# Patient Record
Sex: Female | Born: 1985 | Race: Black or African American | Hispanic: No | Marital: Single | Smoking: Never smoker
Health system: Southern US, Community
[De-identification: ages and names within clinical notes are randomized; demographics above are authoritative.]

## PROBLEM LIST (undated history)

## (undated) DIAGNOSIS — R519 Headache, unspecified: Secondary | ICD-10-CM

## (undated) DIAGNOSIS — A6000 Herpesviral infection of urogenital system, unspecified: Secondary | ICD-10-CM

## (undated) DIAGNOSIS — D649 Anemia, unspecified: Secondary | ICD-10-CM

## (undated) HISTORY — DX: Herpesviral infection of urogenital system, unspecified: A60.00

## (undated) HISTORY — DX: Anemia, unspecified: D64.9

---

## 2006-12-25 ENCOUNTER — Emergency Department (HOSPITAL_COMMUNITY): Admission: EM | Admit: 2006-12-25 | Discharge: 2006-12-25 | Payer: Self-pay | Admitting: Emergency Medicine

## 2006-12-25 IMAGING — US US OB TRANSVAGINAL MODIFY
1 series · 14 of 28 positions shown · non-contrast
Comparison: None.

CLINICAL DATA: Spotting.  
OBSTETRICAL ULTRASOUND <14 WKS AND TRANSVAGINAL OB US:
TECHNIQUE: Both transabdominal and transvaginal ultrasound examinations were performed for complete evaluation of the gestation as well as the maternal uterus, adnexal regions, and pelvic cul-de-sac.

[Series 1: unknown · 0.32mm/px · 14 of 34 slices shown]
[im 2/34]
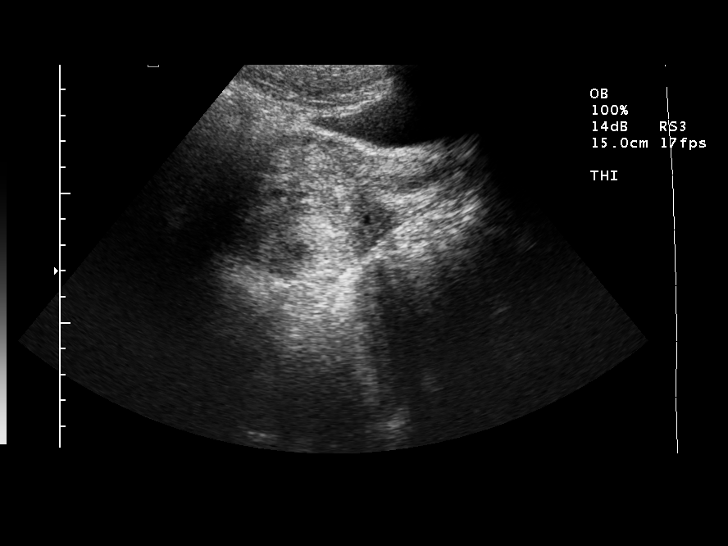
[im 4/34]
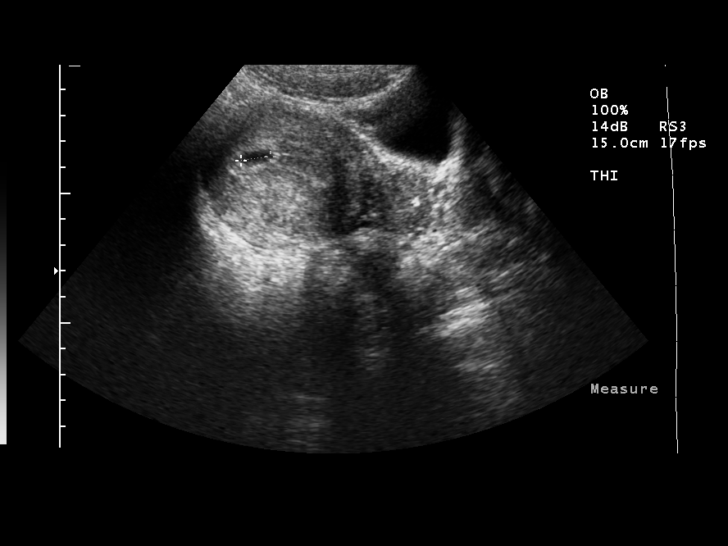
[im 7/34]
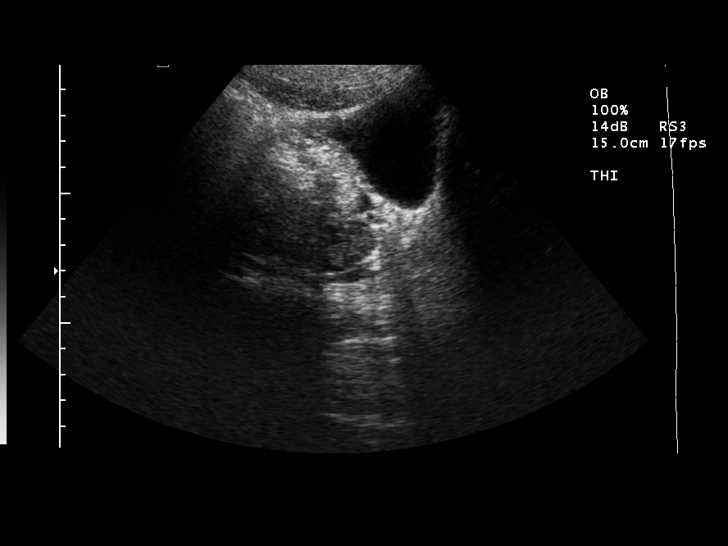
[im 9/34]
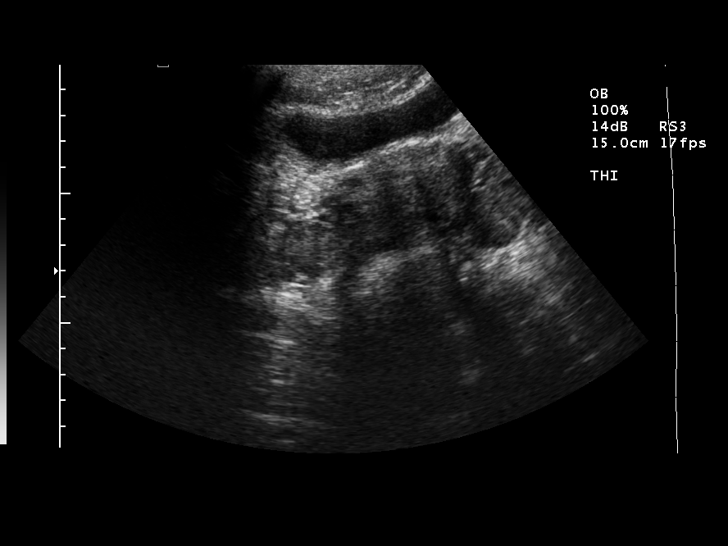
[im 12/34]
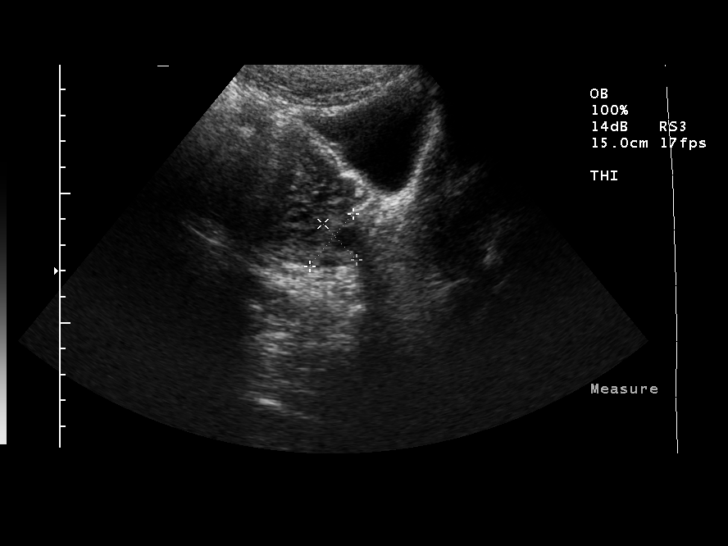
[im 14/34]
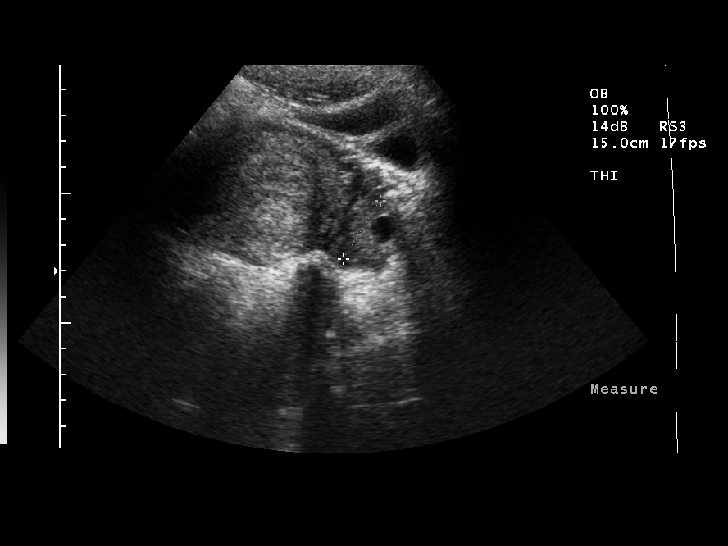
[im 16/34]
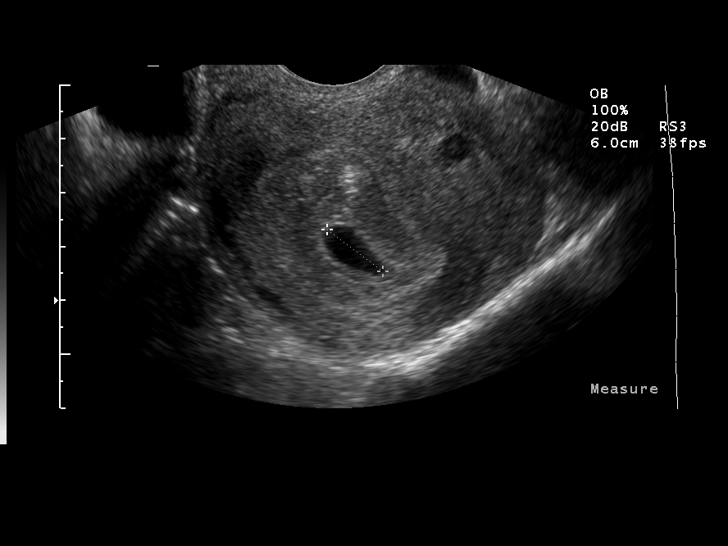
[im 19/34]
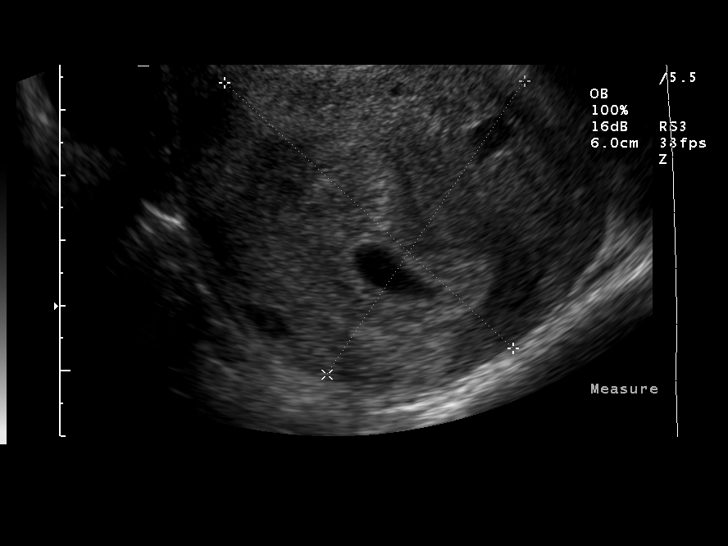
[im 21/34]
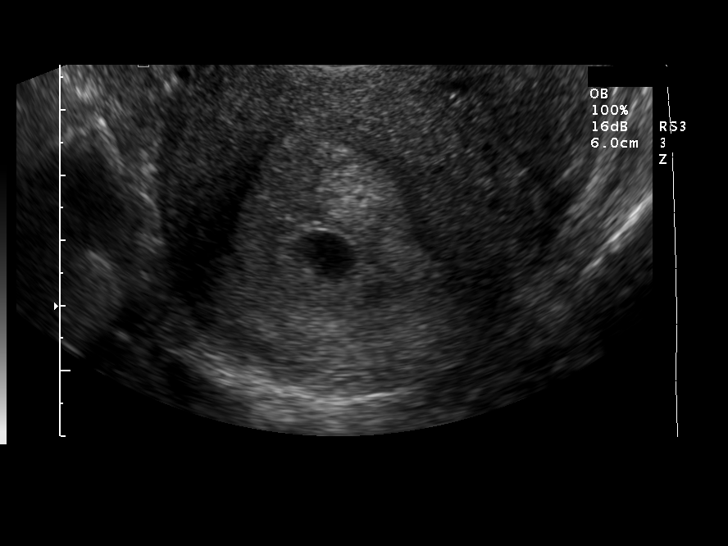
[im 24/34]
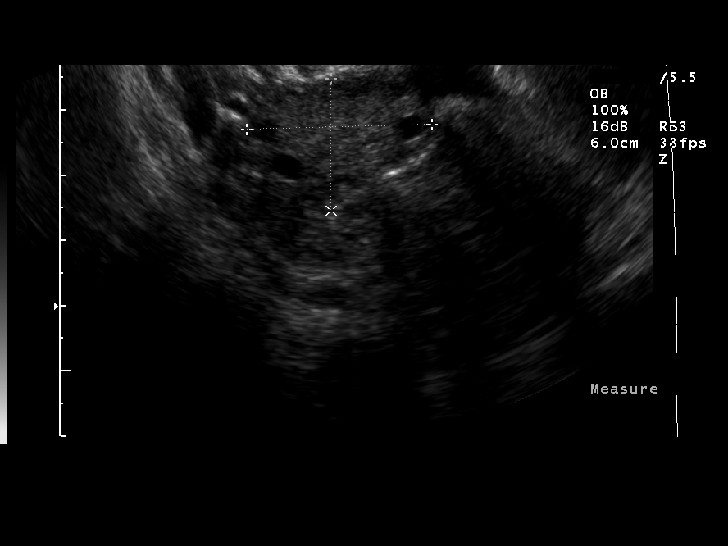
[im 26/34]
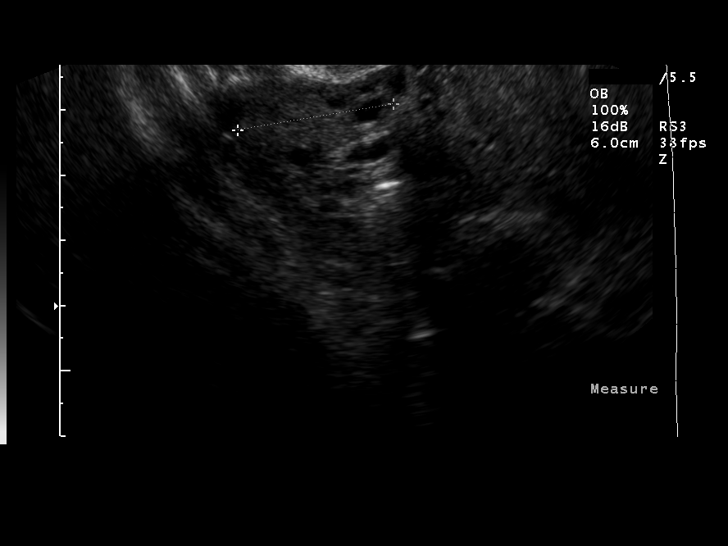
[im 29/34]
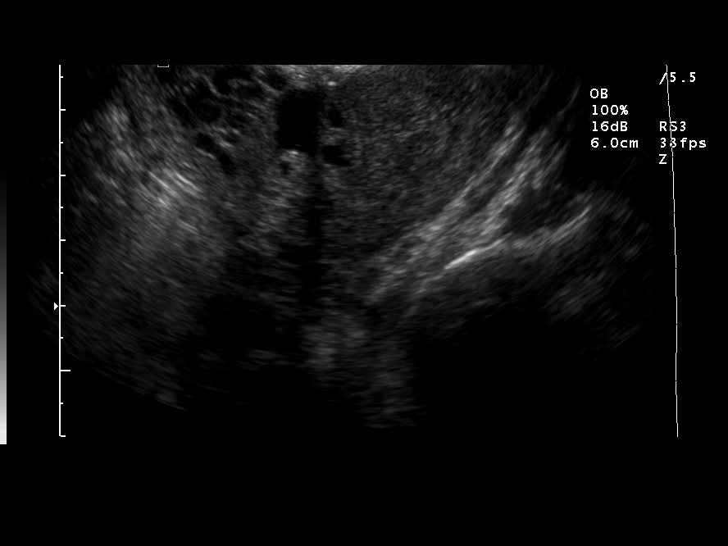
[im 31/34]
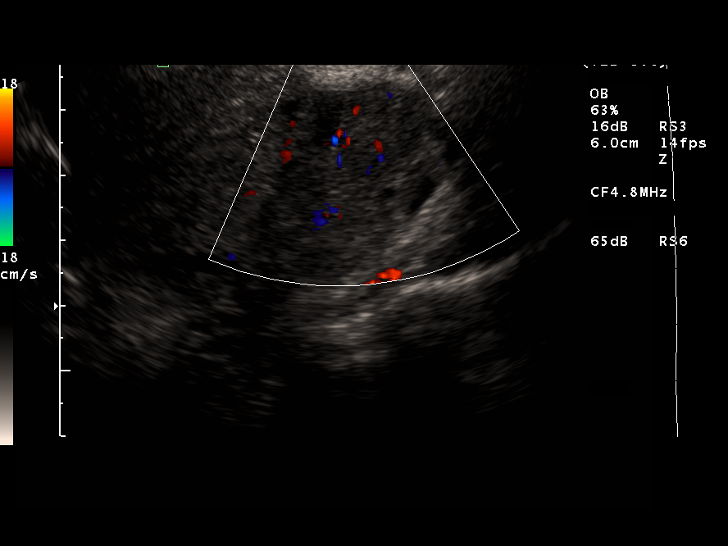
[im 34/34]
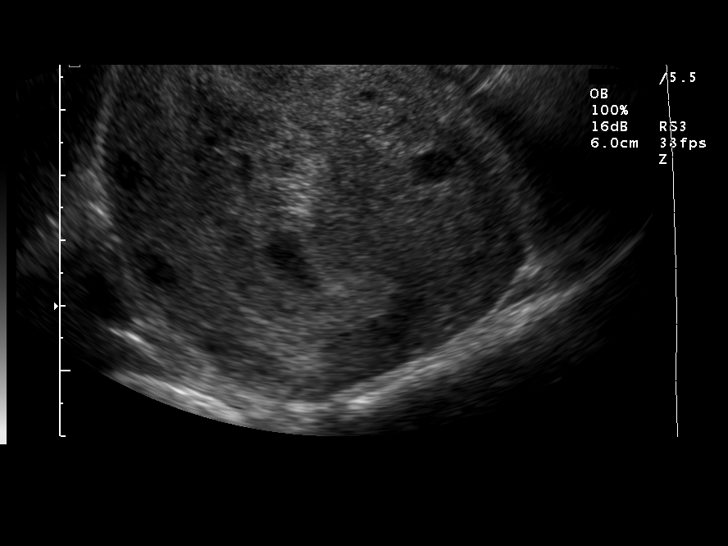

[14 of 28 positions shown; findings below may reference images not displayed]

FINDINGS: There is an intrauterine pregnancy based on a gestational sac that is ovoid in shape.  Based on gestational sac size, gestational age is estimated at 6 weeks 1 day.  There is, however, no visible embryo or yolk sac.  In addition there is evidence suggesting a subchorionic hemorrhage.  The combination of findings makes it highly likely that the patient has aborted.  
Ovaries are unremarkable.  There is a small amount of fluid in the cul-de-sac.
IMPRESSION: 1.  Intrauterine pregnancy estimated at 6 weeks 1 day by sac size.  
2.  No visible fetal pole or yolk sac.  
3.  Probable subchorionic hemorrhage.  
4.  Likely spontaneous abortion, based on the above observations.

## 2007-01-08 ENCOUNTER — Emergency Department (HOSPITAL_COMMUNITY): Admission: EM | Admit: 2007-01-08 | Discharge: 2007-01-08 | Payer: Self-pay | Admitting: Emergency Medicine

## 2007-01-08 ENCOUNTER — Inpatient Hospital Stay (HOSPITAL_COMMUNITY): Admission: AD | Admit: 2007-01-08 | Discharge: 2007-01-08 | Payer: Self-pay | Admitting: Obstetrics and Gynecology

## 2007-01-08 IMAGING — US US OB TRANSVAGINAL
1 series · 19 of 25 positions shown · non-contrast
Comparison: none

OBSTETRICAL ULTRASOUND:

 This ultrasound exam was performed in the [HOSPITAL] Ultrasound Department.  The OB US report was generated in the AS system, and faxed to the ordering physician.  This report is also available in [REDACTED] PACS.

[Series 1: us ob comp less 14 wks · 19 of 25 slices shown]
[im 1/25]
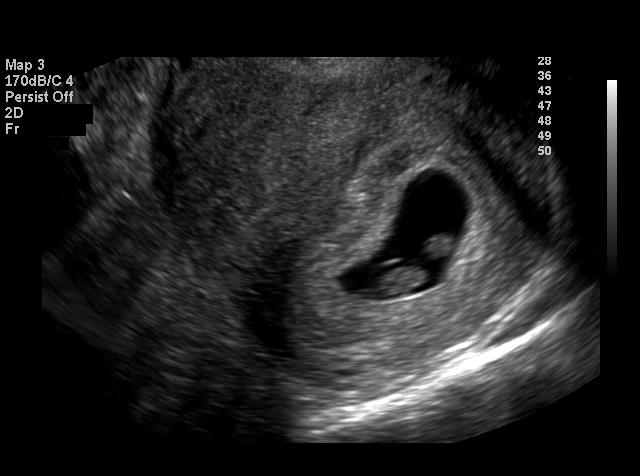
[im 2/25]
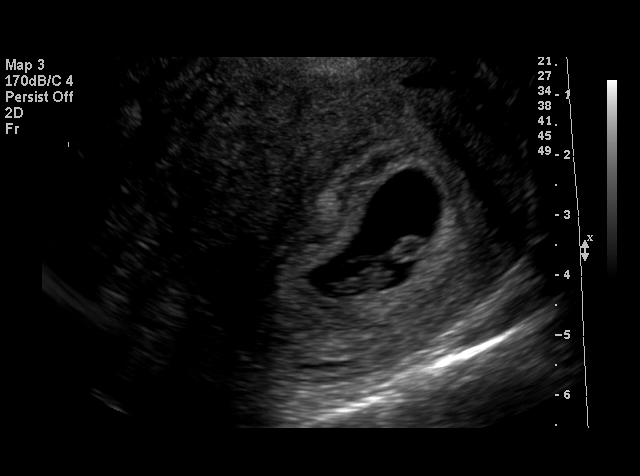
[im 4/25]
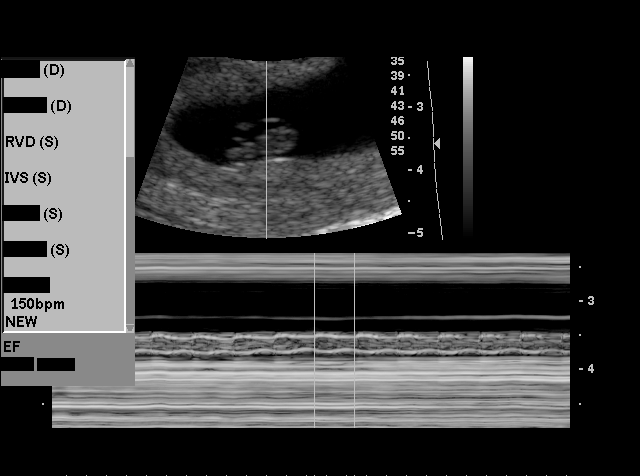
[im 5/25]
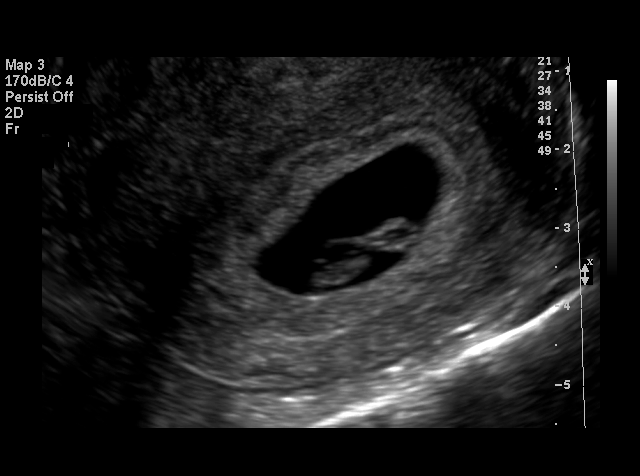
[im 6/25]
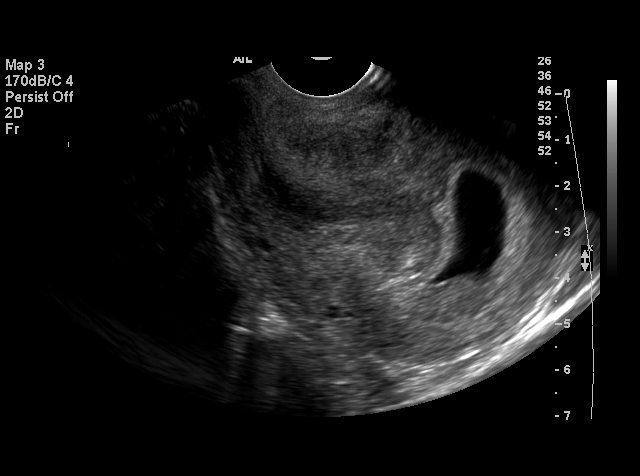
[im 8/25]
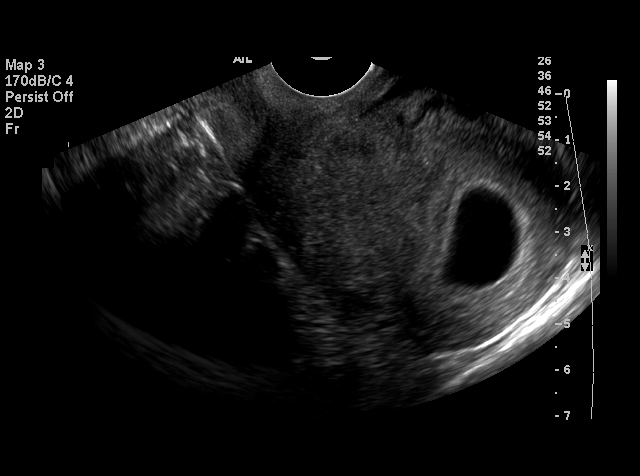
[im 9/25]
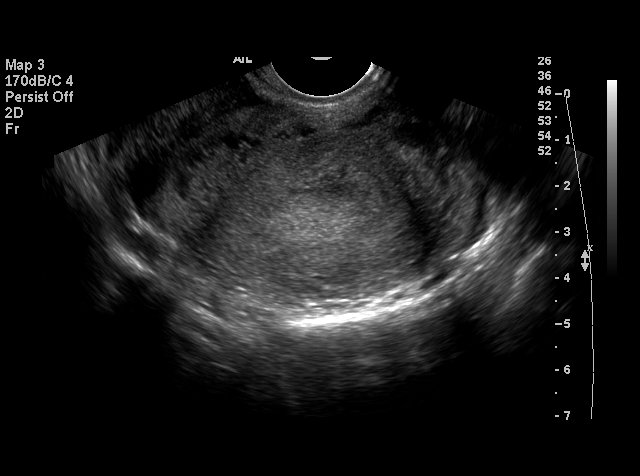
[im 10/25]
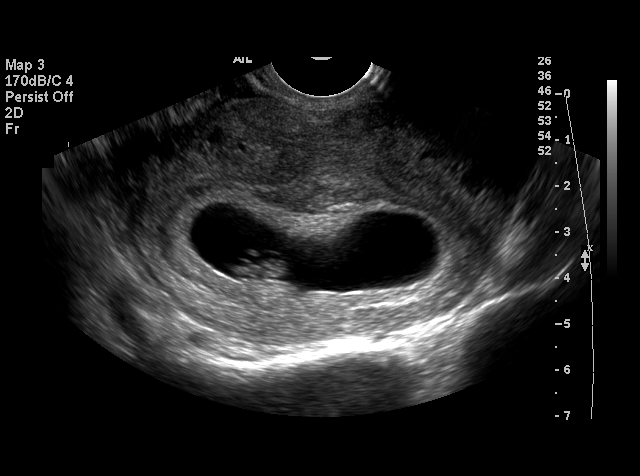
[im 12/25]
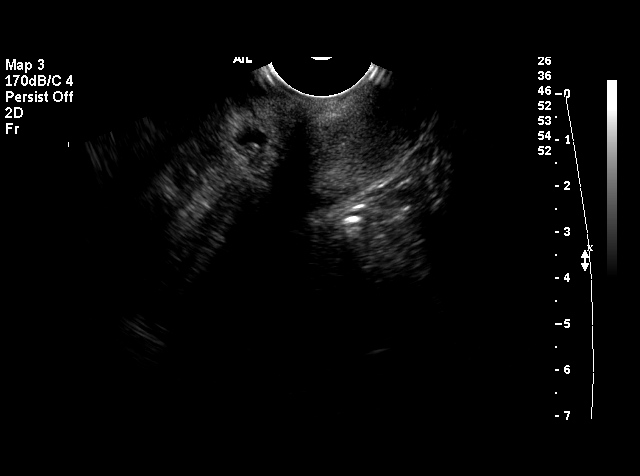
[im 13/25]
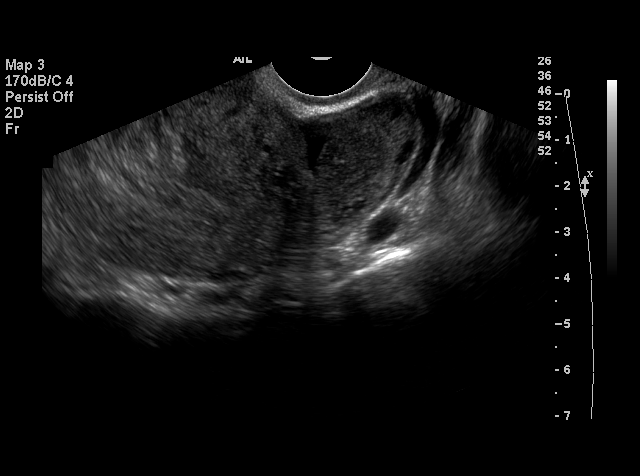
[im 14/25]
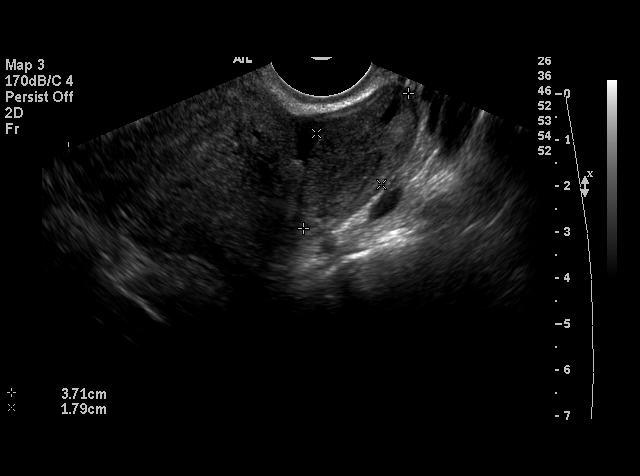
[im 16/25]
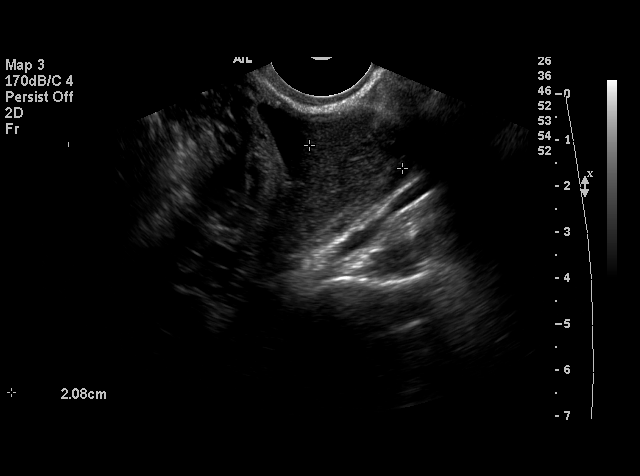
[im 17/25]
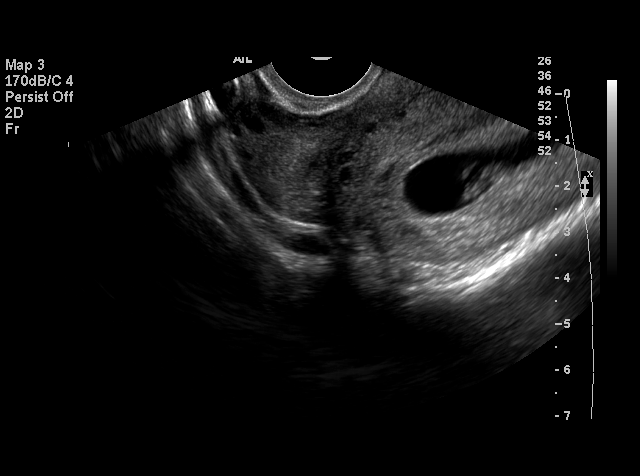
[im 18/25]
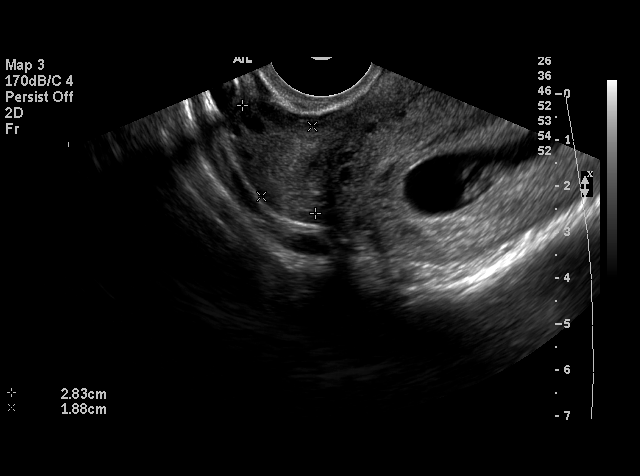
[im 20/25]
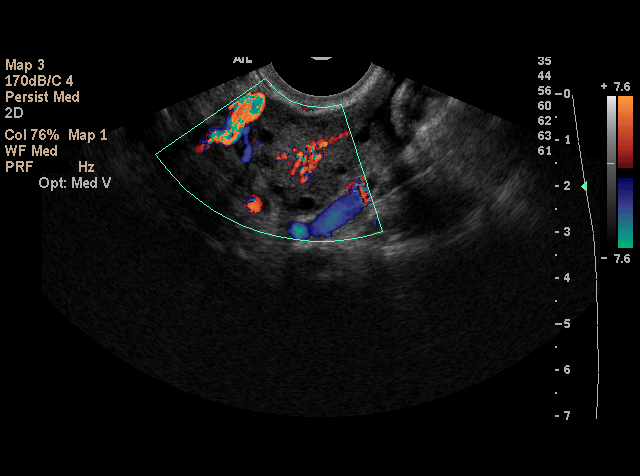
[im 21/25]
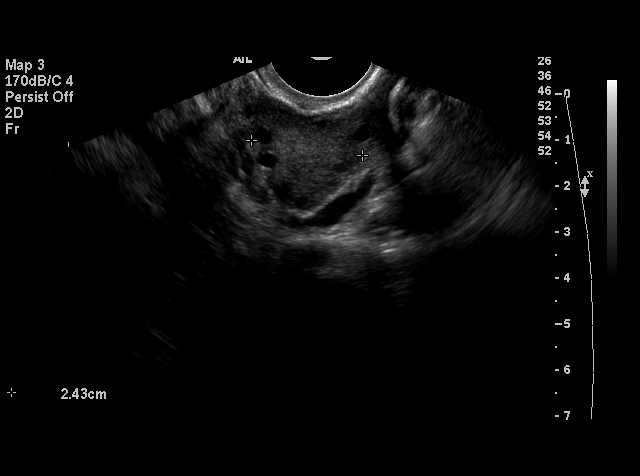
[im 22/25]
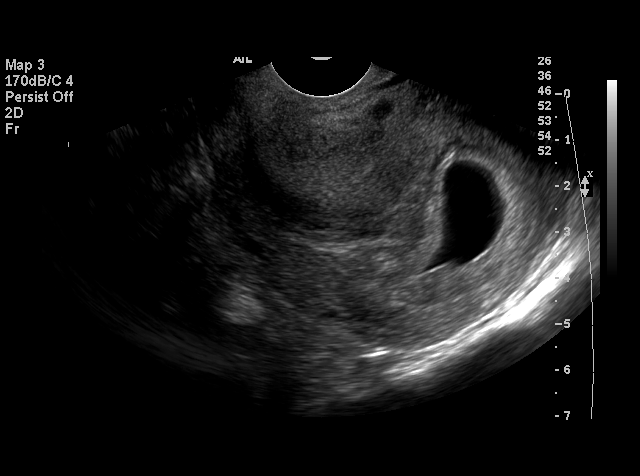
[im 24/25]
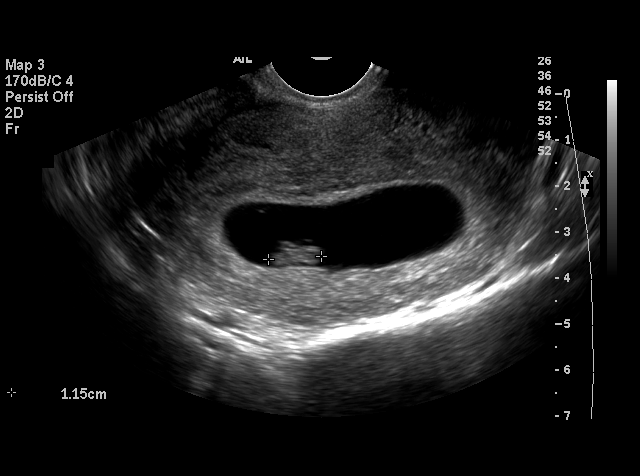
[im 25/25]
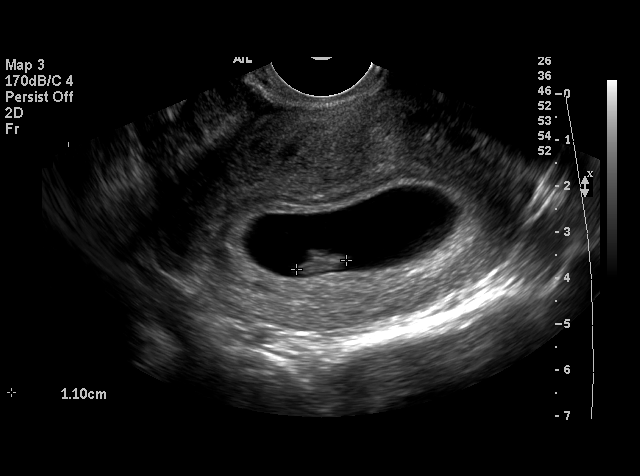

[19 of 25 positions shown; findings below may reference images not displayed]

IMPRESSION: See [facility] Obstetric US report.

## 2007-02-05 ENCOUNTER — Ambulatory Visit (HOSPITAL_COMMUNITY): Admission: RE | Admit: 2007-02-05 | Discharge: 2007-02-05 | Payer: Self-pay | Admitting: Family Medicine

## 2007-02-05 IMAGING — US US OB LIMITED
1 series · 14 of 28 positions shown · non-contrast
Comparison: none

OBSTETRICAL ULTRASOUND:
 This ultrasound was performed in The [HOSPITAL], and the [facility]/GY[facility] report will be stored to [REDACTED] PACS.

[Series 1: us ob limited · 14 of 29 slices shown]
[im 2/29]
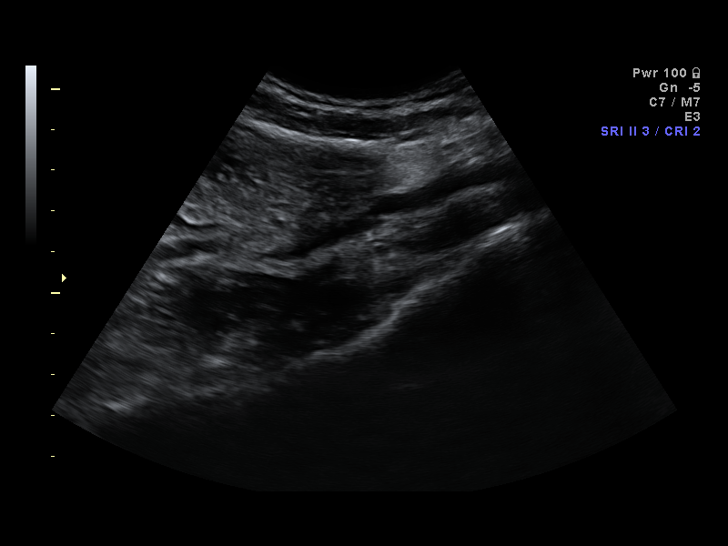
[im 4/29]
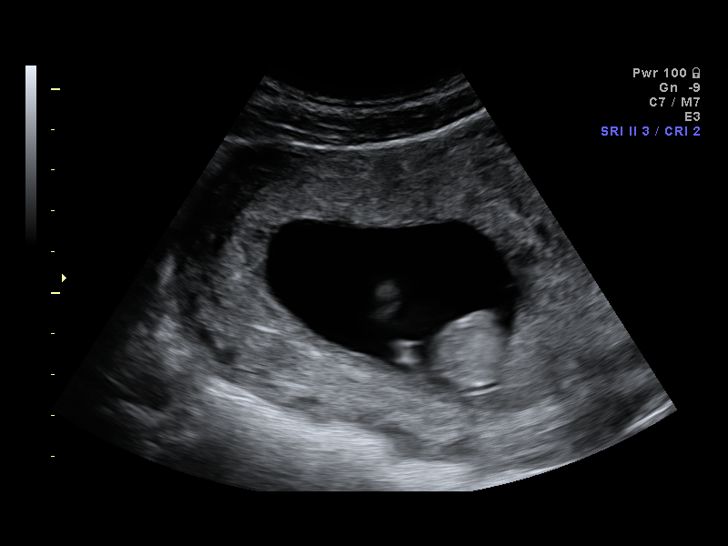
[im 6/29]
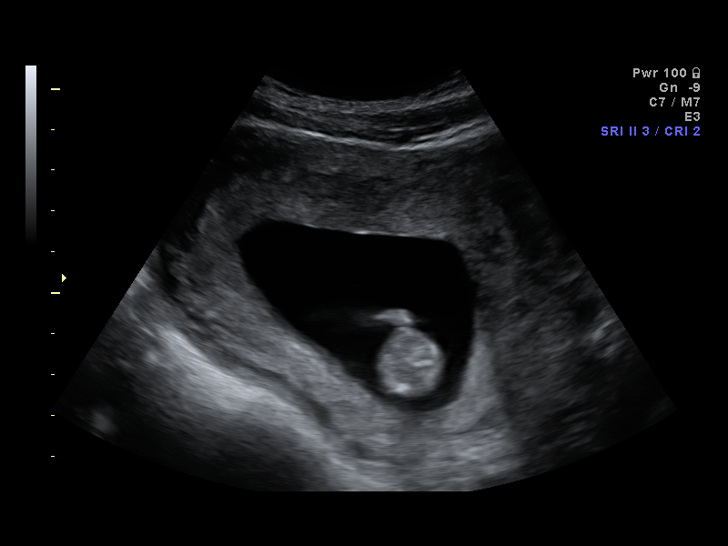
[im 8/29]
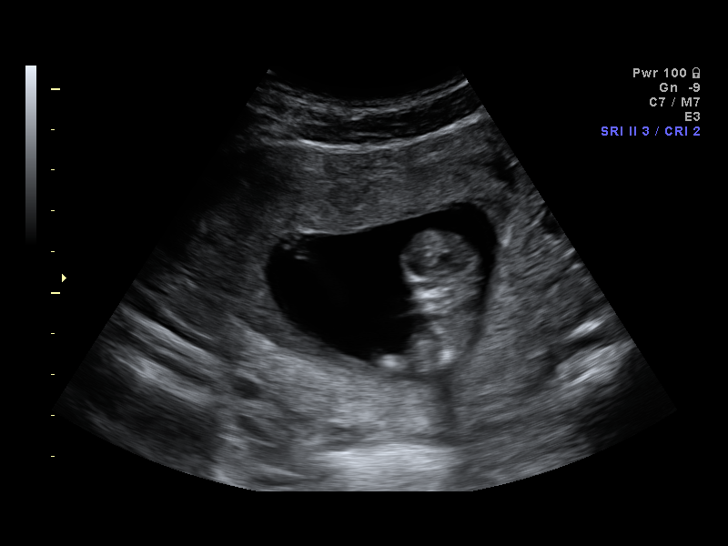
[im 10/29]
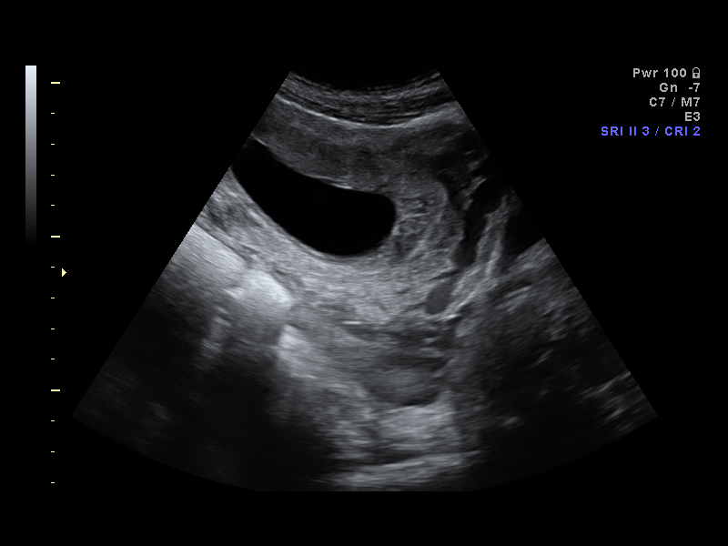
[im 12/29]
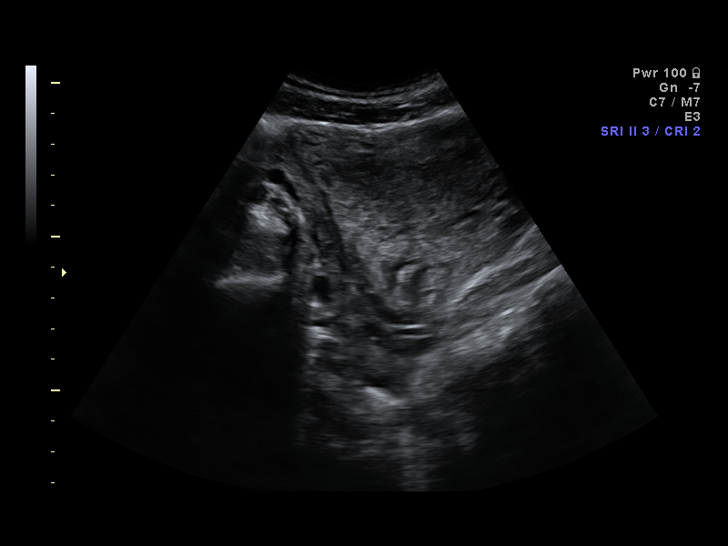
[im 14/29]
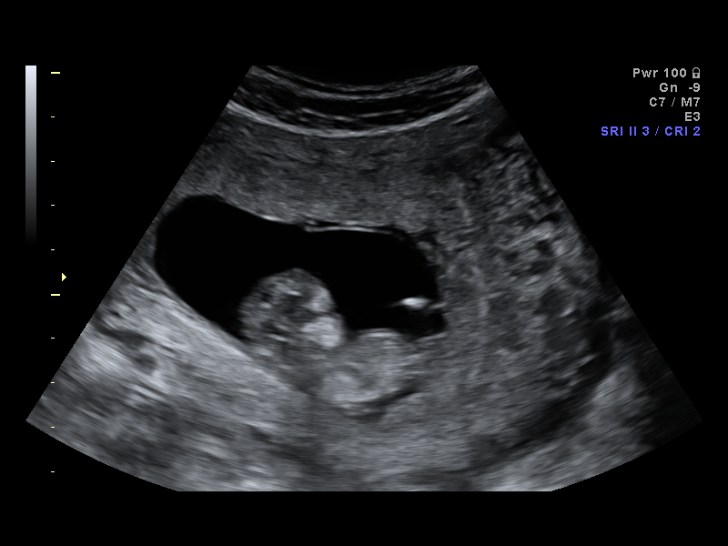
[im 16/29]
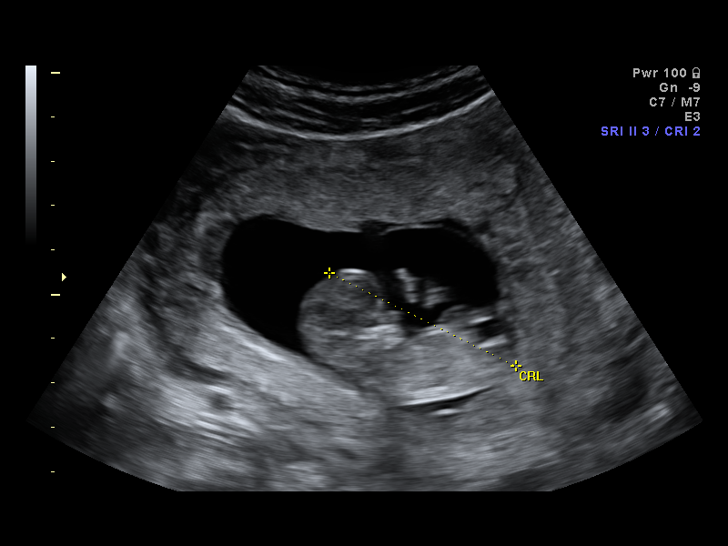
[im 18/29]
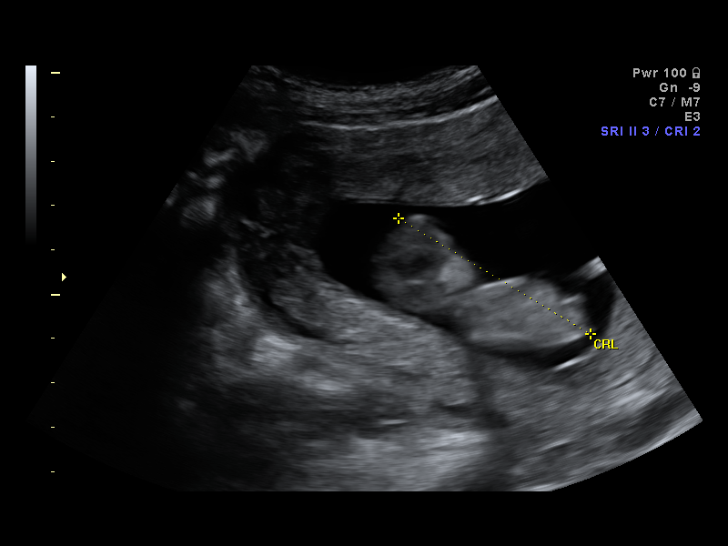
[im 20/29]
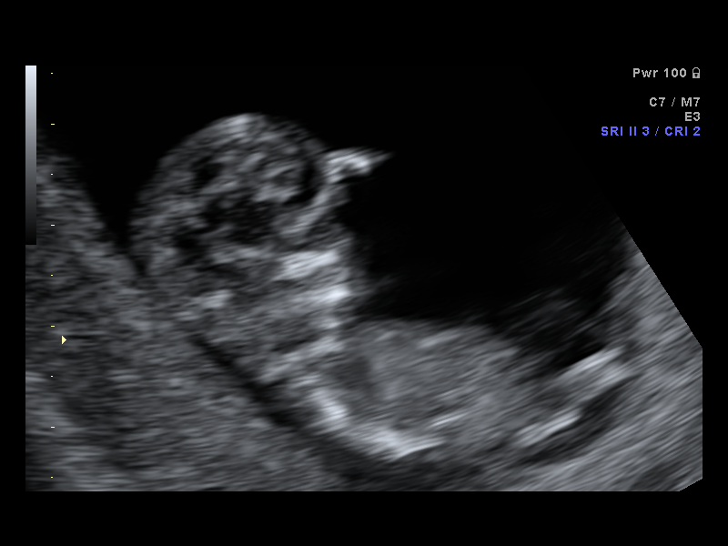
[im 22/29]
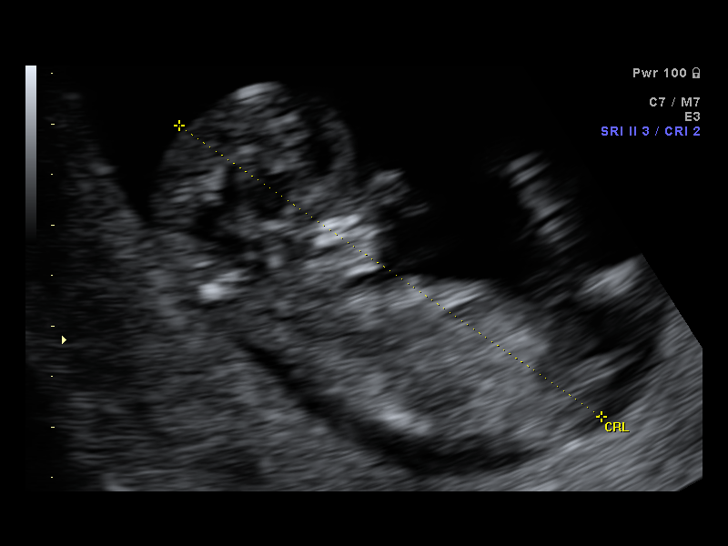
[im 24/29]
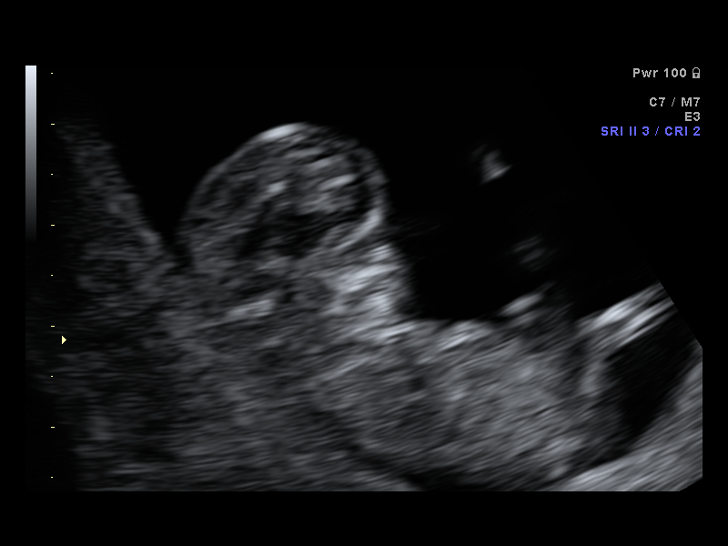
[im 26/29]
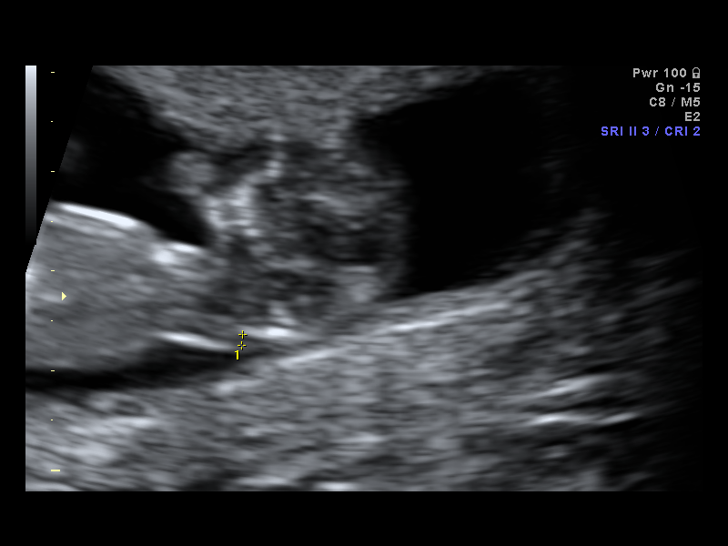
[im 29/29]
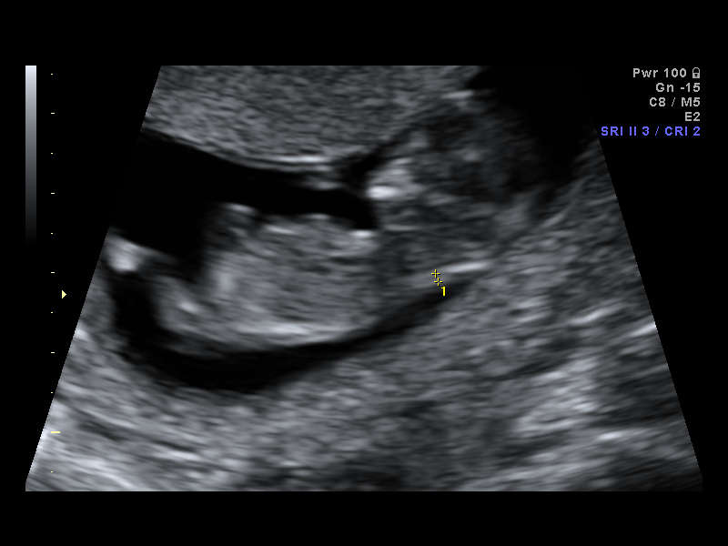

[14 of 28 positions shown; findings below may reference images not displayed]

IMPRESSION: The [facility] report has also been faxed to the ordering physician.

## 2007-02-12 ENCOUNTER — Ambulatory Visit (HOSPITAL_COMMUNITY): Admission: RE | Admit: 2007-02-12 | Discharge: 2007-02-12 | Payer: Self-pay | Admitting: Family Medicine

## 2007-02-12 IMAGING — US US OB NUCHAL TRANSLUCENCY 1ST GEST
1 series · 8 of 8 positions shown · non-contrast
Comparison: none

OBSTETRICAL ULTRASOUND:
 This ultrasound was performed in The [HOSPITAL], and the [facility] report will be stored to [REDACTED] PACS.

[Series 1: us ob nuchal translucency 1st gest · 8 of 8 slices shown]
[im 1/8]
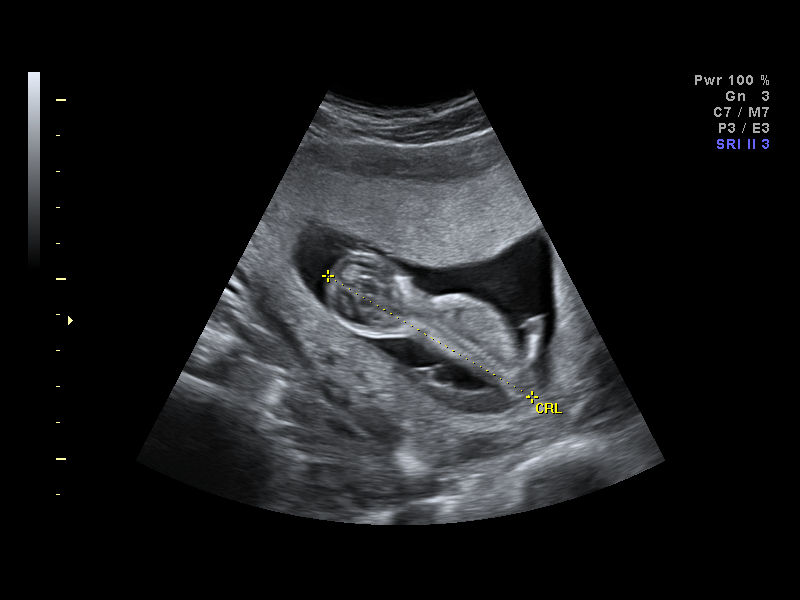
[im 2/8]
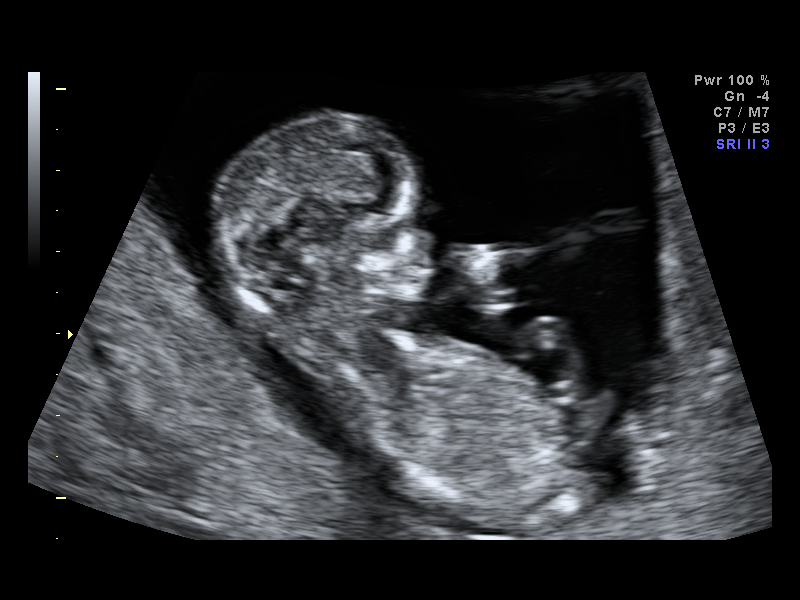
[im 3/8]
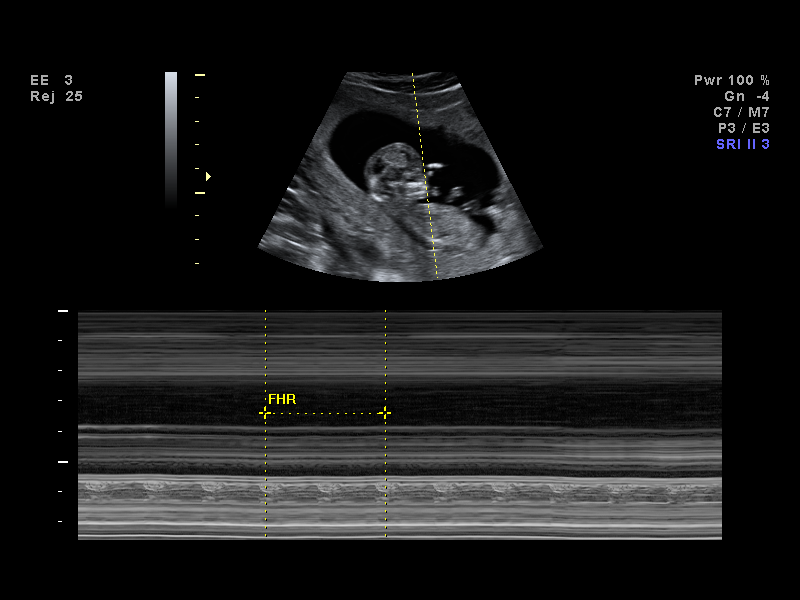
[im 4/8]
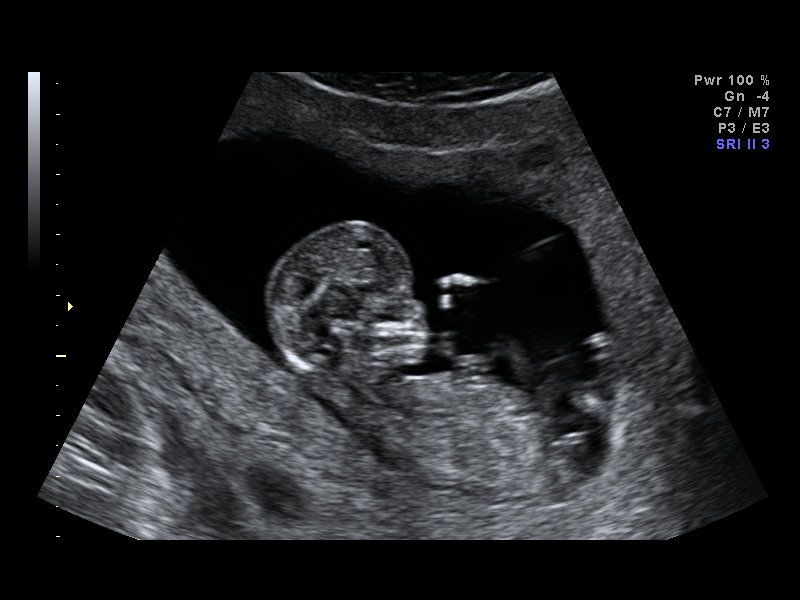
[im 5/8]
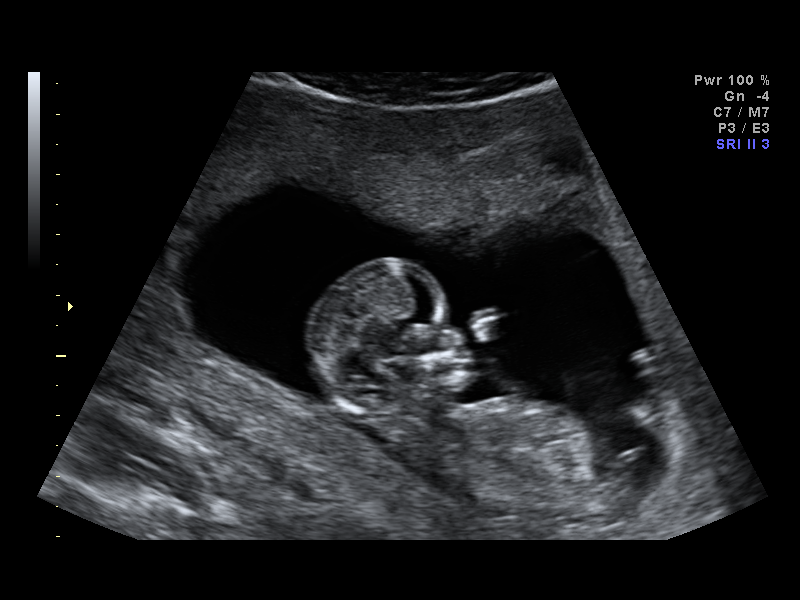
[im 6/8]
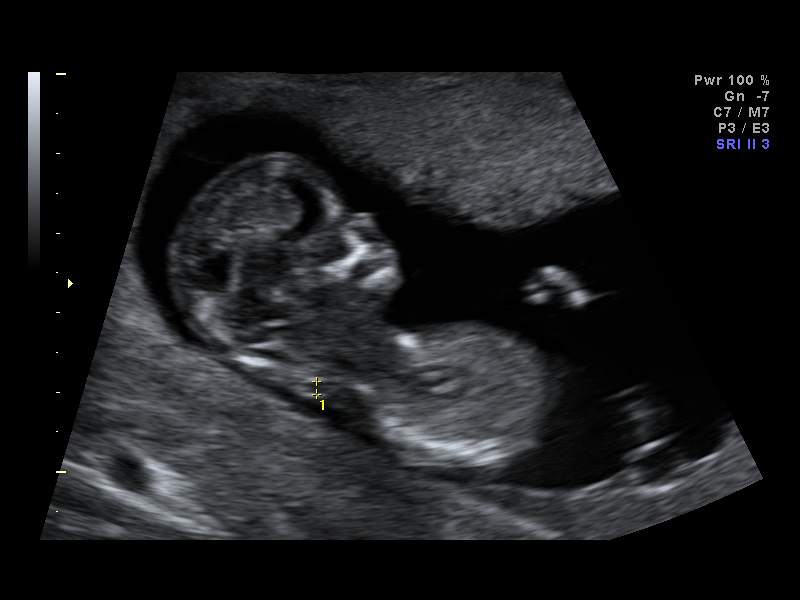
[im 7/8]
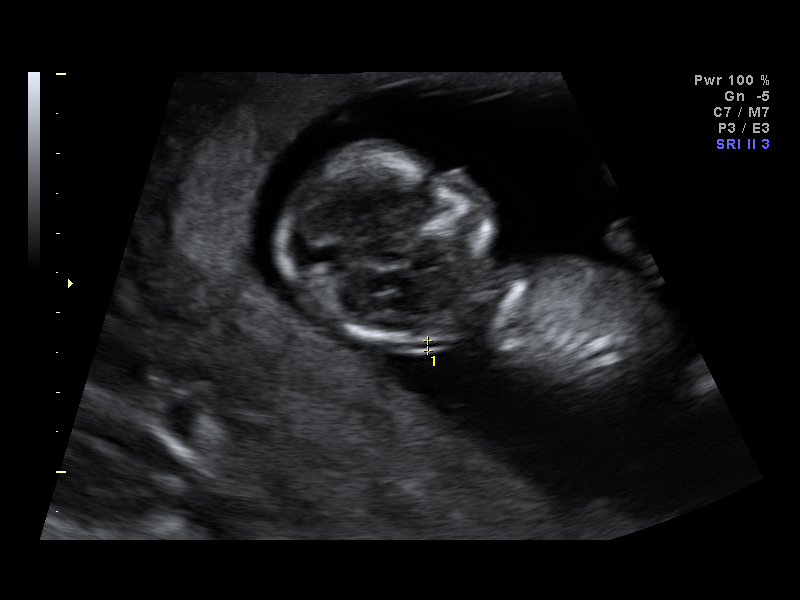
[im 8/8]
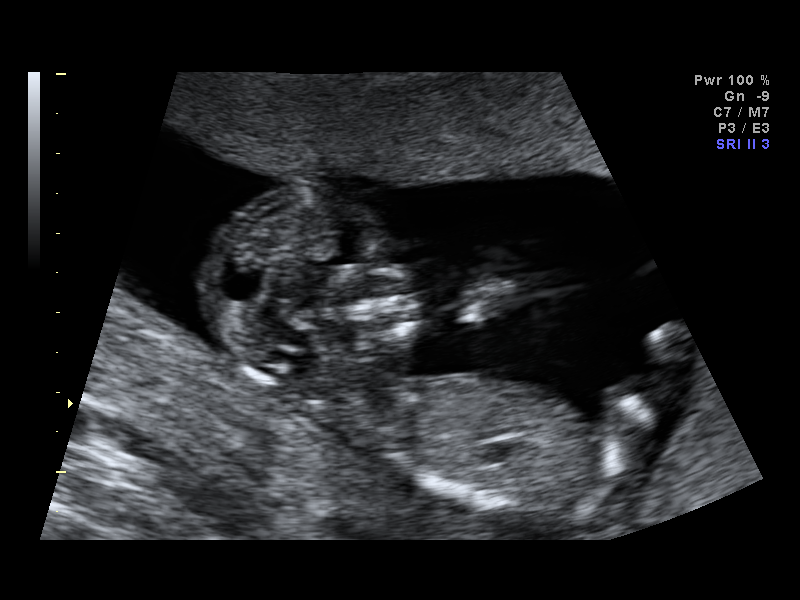

[8 of 8 positions shown; findings below may reference images not displayed]

IMPRESSION: The [facility] report has also been faxed to the ordering physician.

## 2007-03-23 ENCOUNTER — Ambulatory Visit (HOSPITAL_COMMUNITY): Admission: RE | Admit: 2007-03-23 | Discharge: 2007-03-23 | Payer: Self-pay | Admitting: Family Medicine

## 2007-03-23 IMAGING — US US OB DETAIL+14 WK
1 series · 14 of 28 positions shown · non-contrast
Comparison: none

OBSTETRICAL ULTRASOUND:
 This ultrasound was performed in The [HOSPITAL], and the [facility] report will be stored to [REDACTED] PACS.

[Series 1: us ob detail+14 wk · 14 of 101 slices shown]
[im 4/101]
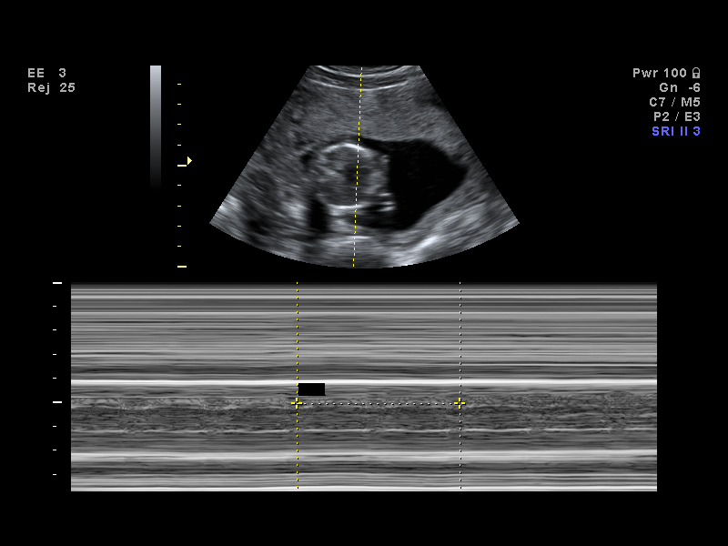
[im 12/101]
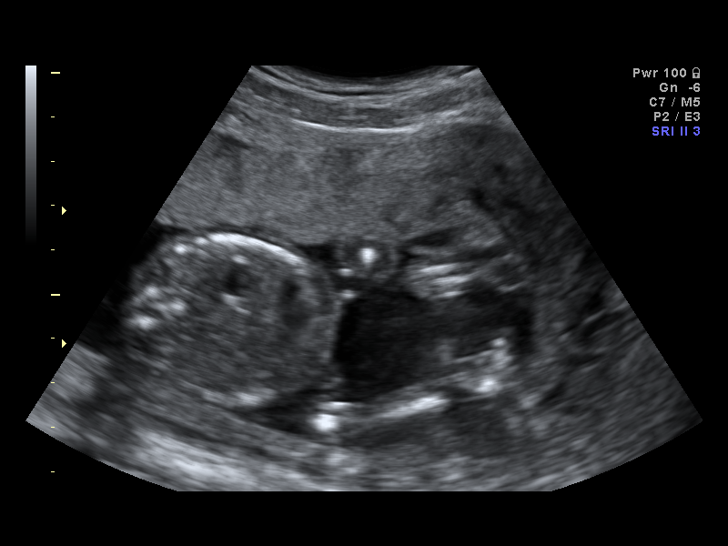
[im 19/101]
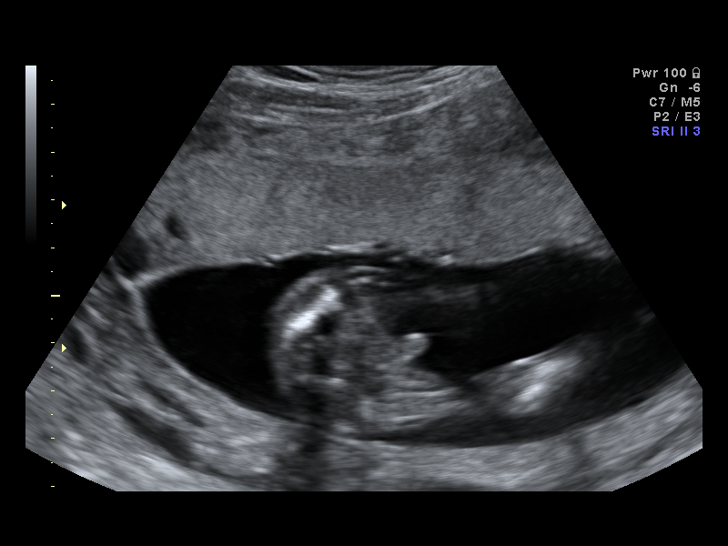
[im 26/101]
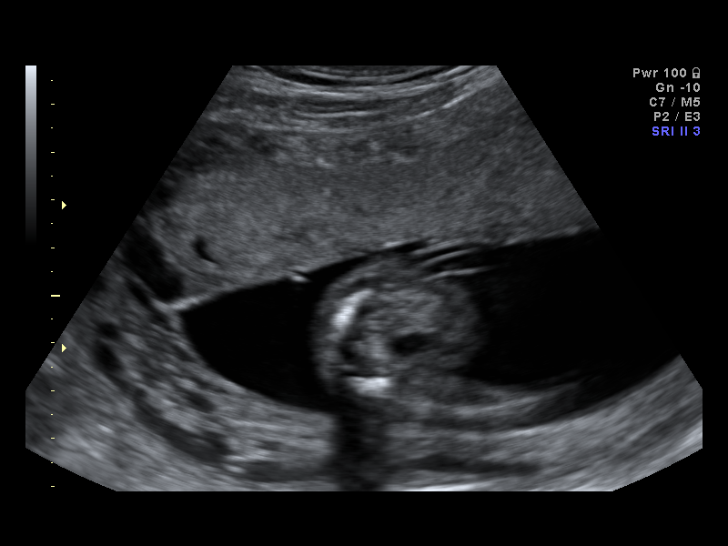
[im 34/101]
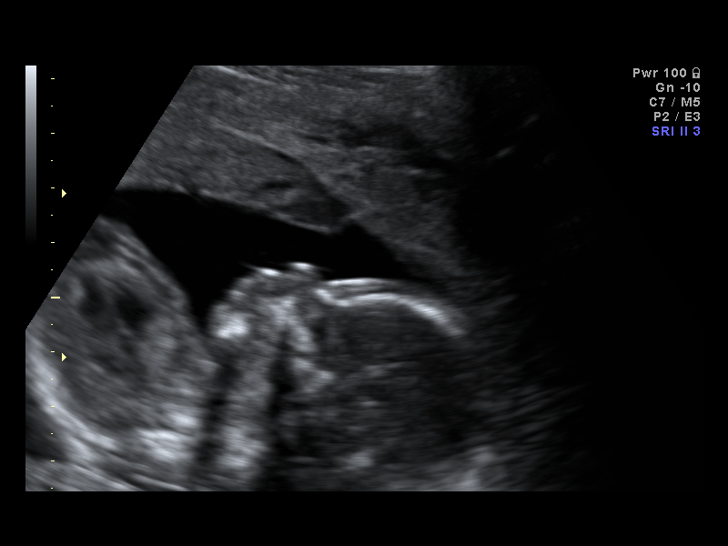
[im 41/101]
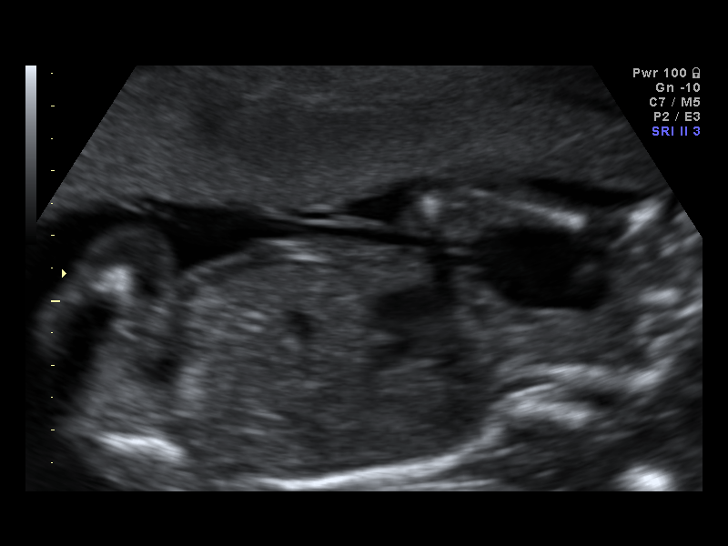
[im 49/101]
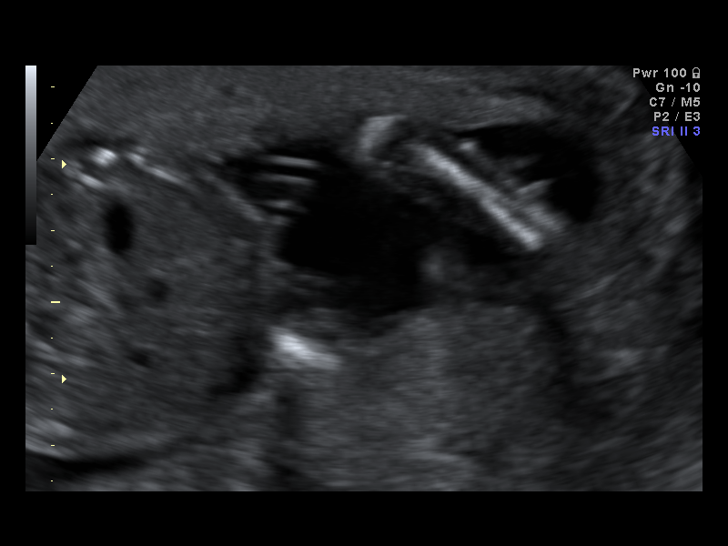
[im 56/101]
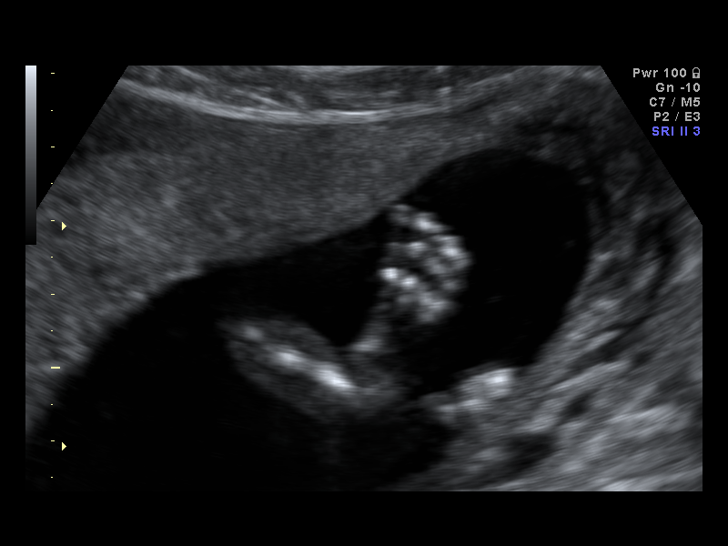
[im 63/101]
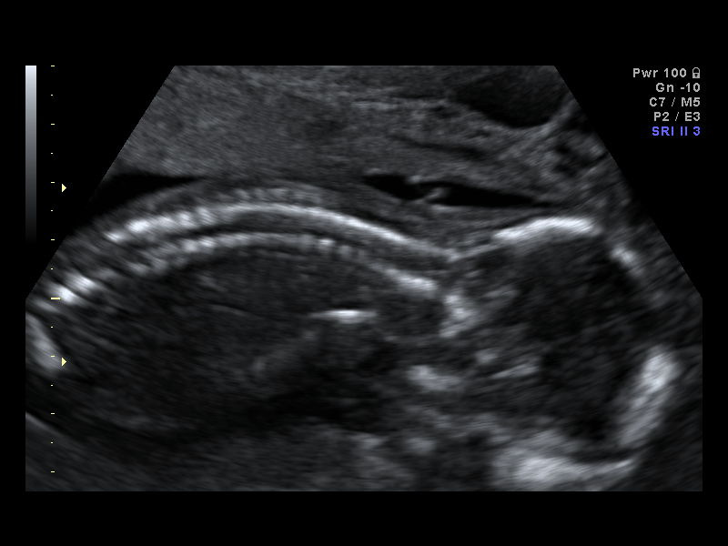
[im 71/101]
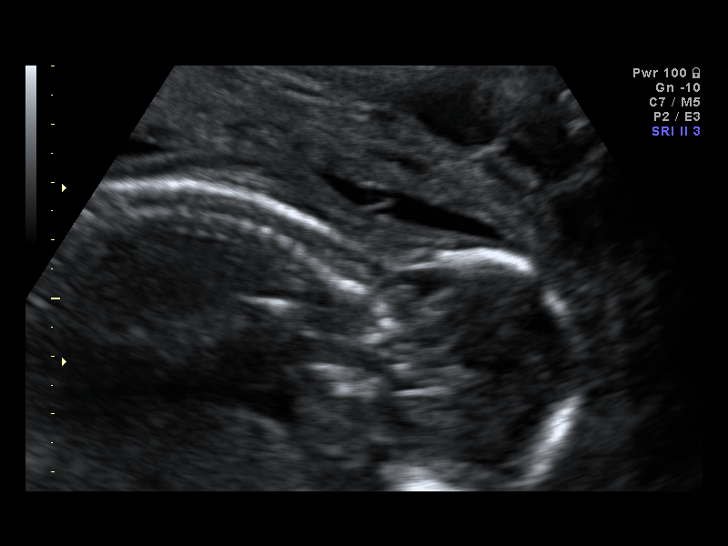
[im 78/101]
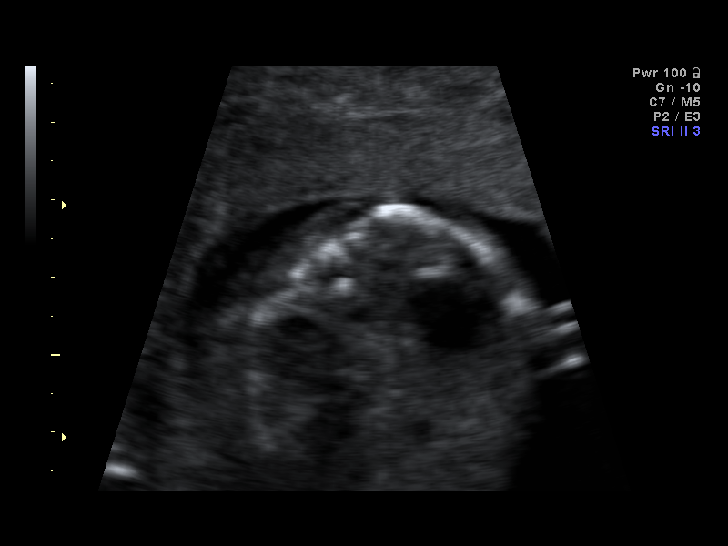
[im 86/101]
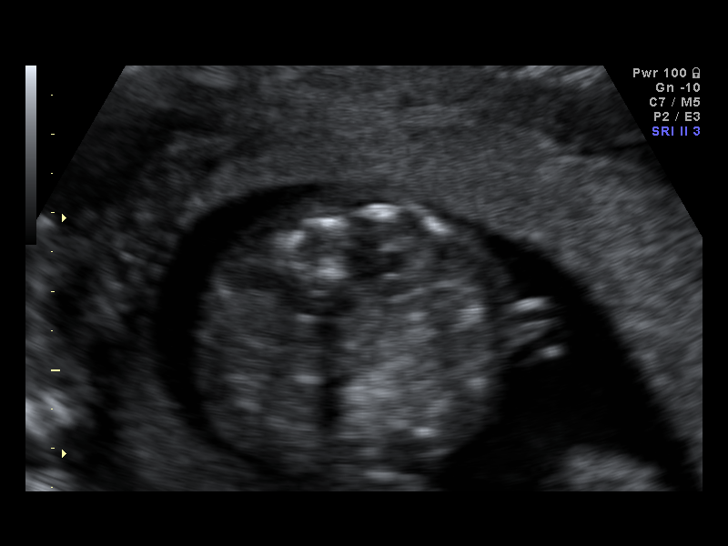
[im 93/101]
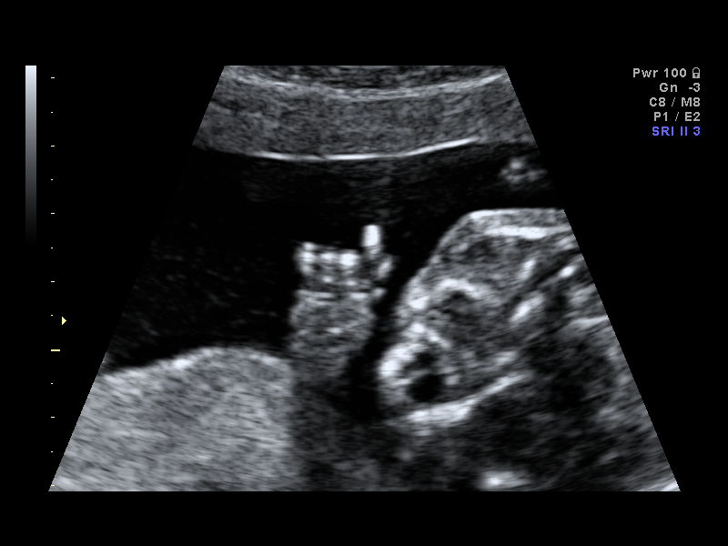
[im 101/101]
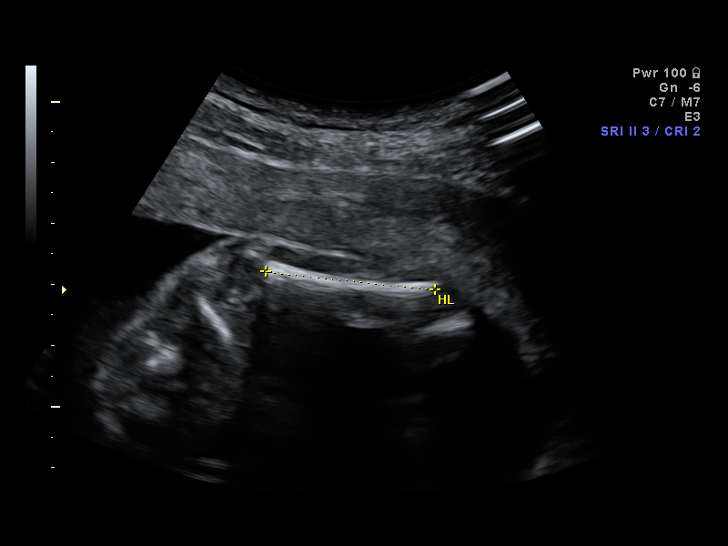

[14 of 28 positions shown; findings below may reference images not displayed]

IMPRESSION: The [facility] report has also been faxed to the ordering physician.

## 2007-04-06 ENCOUNTER — Inpatient Hospital Stay (HOSPITAL_COMMUNITY): Admission: AD | Admit: 2007-04-06 | Discharge: 2007-04-06 | Payer: Self-pay | Admitting: Obstetrics & Gynecology

## 2007-04-06 ENCOUNTER — Ambulatory Visit: Payer: Self-pay | Admitting: Physician Assistant

## 2007-08-16 ENCOUNTER — Inpatient Hospital Stay (HOSPITAL_COMMUNITY): Admission: AD | Admit: 2007-08-16 | Discharge: 2007-08-16 | Payer: Self-pay | Admitting: Gynecology

## 2007-08-18 ENCOUNTER — Inpatient Hospital Stay (HOSPITAL_COMMUNITY): Admission: AD | Admit: 2007-08-18 | Discharge: 2007-08-21 | Payer: Self-pay | Admitting: Gynecology

## 2007-08-18 ENCOUNTER — Ambulatory Visit: Payer: Self-pay | Admitting: Obstetrics and Gynecology

## 2007-08-19 ENCOUNTER — Encounter (INDEPENDENT_AMBULATORY_CARE_PROVIDER_SITE_OTHER): Payer: Self-pay | Admitting: Gynecology

## 2009-04-21 ENCOUNTER — Emergency Department (HOSPITAL_COMMUNITY): Admission: EM | Admit: 2009-04-21 | Discharge: 2009-04-21 | Payer: Self-pay | Admitting: Emergency Medicine

## 2011-01-23 LAB — POCT PREGNANCY, URINE: Preg Test, Ur: NEGATIVE

## 2011-01-23 LAB — URINALYSIS, ROUTINE W REFLEX MICROSCOPIC
Nitrite: NEGATIVE
Protein, ur: NEGATIVE mg/dL
Specific Gravity, Urine: 1.014 (ref 1.005–1.030)

## 2011-01-23 LAB — URINE MICROSCOPIC-ADD ON

## 2011-07-26 LAB — CBC
HCT: 32 — ABNORMAL LOW
HCT: 36.7
Hemoglobin: 10.9 — ABNORMAL LOW
Hemoglobin: 12.6
MCV: 84.4
MCV: 86.8
Platelets: 323
RDW: 13.2
RDW: 13.6
WBC: 15.8 — ABNORMAL HIGH

## 2011-07-26 LAB — RPR: RPR Ser Ql: NONREACTIVE

## 2011-08-03 LAB — URINALYSIS, ROUTINE W REFLEX MICROSCOPIC
Bilirubin Urine: NEGATIVE
Specific Gravity, Urine: 1.02
Urobilinogen, UA: 0.2

## 2011-08-03 LAB — URINE MICROSCOPIC-ADD ON

## 2012-04-25 ENCOUNTER — Emergency Department (HOSPITAL_COMMUNITY)
Admission: EM | Admit: 2012-04-25 | Discharge: 2012-04-26 | Disposition: A | Discharge status: Home / Self Care | Payer: Self-pay | Attending: Emergency Medicine | Admitting: Emergency Medicine

## 2012-04-25 ENCOUNTER — Encounter (HOSPITAL_COMMUNITY): Payer: Self-pay | Admitting: *Deleted

## 2012-04-25 DIAGNOSIS — R197 Diarrhea, unspecified: Secondary | ICD-10-CM | POA: Insufficient documentation

## 2012-04-25 DIAGNOSIS — N898 Other specified noninflammatory disorders of vagina: Secondary | ICD-10-CM | POA: Insufficient documentation

## 2012-04-25 DIAGNOSIS — A64 Unspecified sexually transmitted disease: Secondary | ICD-10-CM | POA: Insufficient documentation

## 2012-04-25 DIAGNOSIS — L2989 Other pruritus: Secondary | ICD-10-CM | POA: Insufficient documentation

## 2012-04-25 DIAGNOSIS — R109 Unspecified abdominal pain: Secondary | ICD-10-CM | POA: Insufficient documentation

## 2012-04-25 DIAGNOSIS — L298 Other pruritus: Secondary | ICD-10-CM | POA: Insufficient documentation

## 2012-04-25 LAB — URINE MICROSCOPIC-ADD ON

## 2012-04-25 LAB — COMPREHENSIVE METABOLIC PANEL
AST: 13 U/L (ref 0–37)
CO2: 23 mEq/L (ref 19–32)
Calcium: 9.3 mg/dL (ref 8.4–10.5)
Chloride: 103 mEq/L (ref 96–112)
GFR calc Af Amer: >90 mL/min (ref >90)
GFR calc non Af Amer: >90 mL/min (ref >90)
Potassium: 3.8 mEq/L (ref 3.5–5.1)
Sodium: 136 mEq/L (ref 135–145)
Total Bilirubin: 0.7 mg/dL (ref 0.3–1.2)

## 2012-04-25 LAB — URINALYSIS, ROUTINE W REFLEX MICROSCOPIC
Protein, ur: NEGATIVE mg/dL
Specific Gravity, Urine: 1.02 (ref 1.005–1.030)
Urobilinogen, UA: 1 mg/dL (ref 0.0–1.0)
pH: 7 (ref 5.0–8.0)

## 2012-04-25 LAB — CBC
HCT: 37.7 % (ref 36.0–46.0)
MCHC: 33.7 g/dL (ref 30.0–36.0)
MCV: 85.5 fL (ref 78.0–100.0)
Platelets: 308 10*3/uL (ref 150–400)
RDW: 12.4 % (ref 11.5–15.5)
WBC: 5.1 10*3/uL (ref 4.0–10.5)

## 2012-04-25 LAB — POCT PREGNANCY, URINE: Preg Test, Ur: NEGATIVE

## 2012-04-25 NOTE — ED Notes (Signed)
Reports lower abd pain since yesterday, having diarrhea and mild vaginal discharge/itching. Denies urinary symptoms.

## 2012-04-26 LAB — WET PREP, GENITAL: Trich, Wet Prep: NONE SEEN

## 2012-04-26 MED ORDER — LIDOCAINE HCL (PF) 1 % IJ SOLN
INTRAMUSCULAR | Status: AC
  Administered 2012-04-26: 5 mL
  Filled 2012-04-26: qty 5

## 2012-04-26 MED ORDER — CEFTRIAXONE SODIUM 250 MG IJ SOLR
250.0000 mg | Freq: Once | INTRAMUSCULAR | Status: AC
  Administered 2012-04-26: 250 mg via INTRAMUSCULAR
  Filled 2012-04-26: qty 250

## 2012-04-26 MED ORDER — AZITHROMYCIN 250 MG PO TABS
1000.0000 mg | ORAL_TABLET | Freq: Once | ORAL | Status: AC
  Administered 2012-04-26: 1000 mg via ORAL
  Filled 2012-04-26: qty 4

## 2012-04-26 NOTE — ED Notes (Signed)
Pt ambulated with a  Steady gait; VSS; A&Ox3; no signs of distress; respirations even and unlabored; skin warm and dry; no questions.  

## 2012-04-26 NOTE — ED Provider Notes (Addendum)
History     CSN: [id number]  Arrival date & time 04/25/12  1557   First MD Initiated Contact with Patient 04/25/12 2202      Chief Complaint  Patient presents with  . Abdominal Pain    (Consider location/radiation/quality/duration/timing/severity/associated sxs/prior treatment) Patient is a 26 y.o. female presenting with abdominal pain. The history is provided by the patient.  Abdominal Pain The primary symptoms of the illness include abdominal pain, diarrhea and vaginal discharge. The primary symptoms of the illness do not include fever, nausea, vomiting or dysuria. The current episode started yesterday (vaginal discharge for 1 month). The onset of the illness was gradual. The problem has been gradually worsening.  The pain came on gradually. The abdominal pain has been unchanged since its onset. The abdominal pain is located in the LLQ and suprapubic region. The abdominal pain does not radiate. The severity of the abdominal pain is 2/10. The abdominal pain is relieved by nothing. Exacerbated by: nothing.  The diarrhea began today. The diarrhea is watery. The diarrhea occurs once per day.   Onset: about 1 month. Vaginal discharge is a new problem. The patient believes that the vaginal discharge is unchanged since it began. The amount of discharge is scant. The color of the discharge is clear. The vaginal discharge is associated with itching. The vaginal discharge is not associated with dysuria.   The patient states that she believes she is currently not pregnant. Significant associated medical issues do not include diabetes or gallstones.    History reviewed. No pertinent past medical history.  History reviewed. No pertinent past surgical history.  History reviewed. No pertinent family history.  History  Substance Use Topics  . Smoking status: Not on file  . Smokeless tobacco: Not on file  . Alcohol Use: No    OB History    Grav Para Term Preterm Abortions TAB SAB Ect Mult  Living                  Review of Systems  Constitutional: Negative for fever.  Gastrointestinal: Positive for abdominal pain and diarrhea. Negative for nausea and vomiting.  Genitourinary: Positive for vaginal discharge. Negative for dysuria.  Skin: Positive for itching.  All other systems reviewed and are negative.    Allergies  Review of patient's allergies indicates no known allergies.  Home Medications  No current outpatient prescriptions on file.  BP 111/78  Pulse 78  Temp 98.1 F (36.7 C) (Oral)  Resp 18  SpO2 100%  LMP 03/28/2012  Physical Exam  Nursing note and vitals reviewed. Constitutional: She is oriented to person, place, and time. She appears well-developed and well-nourished. No distress.  HENT:  Head: Normocephalic and atraumatic.  Mouth/Throat: Oropharynx is clear and moist.  Eyes: Conjunctivae and EOM are normal. Pupils are equal, round, and reactive to light.  Neck: Normal range of motion. Neck supple.  Cardiovascular: Normal rate, regular rhythm and intact distal pulses.   No murmur heard. Pulmonary/Chest: Effort normal and breath sounds normal. No respiratory distress. She has no wheezes. She has no rales.  Abdominal: Soft. Normal appearance. She exhibits no distension. There is tenderness in the suprapubic area and left lower quadrant. There is no rebound and no guarding.  Genitourinary: Uterus normal. Cervix exhibits discharge. Cervix exhibits no motion tenderness and no friability. Right adnexum displays no mass and no tenderness. Left adnexum displays no mass and no tenderness. There is bleeding around the vagina. No tenderness around the vagina. Vaginal discharge found.  Musculoskeletal:  Normal range of motion. She exhibits no edema and no tenderness.  Neurological: She is alert and oriented to person, place, and time.  Skin: Skin is warm and dry. No rash noted. No erythema.  Psychiatric: She has a normal mood and affect. Her behavior is normal.      ED Course  Procedures (including critical care time)  Labs Reviewed  URINALYSIS, ROUTINE W REFLEX MICROSCOPIC - Abnormal; Notable for the following:    APPearance TURBID (*)     Ketones, ur 15 (*)     Leukocytes, UA LARGE (*)     All other components within normal limits  URINE MICROSCOPIC-ADD ON - Abnormal; Notable for the following:    Squamous Epithelial / LPF FEW (*)     Bacteria, UA FEW (*)     All other components within normal limits  WET PREP, GENITAL - Abnormal; Notable for the following:    Clue Cells Wet Prep HPF POC FEW (*)     WBC, Wet Prep HPF POC MANY (*)     All other components within normal limits  CBC  COMPREHENSIVE METABOLIC PANEL  POCT PREGNANCY, URINE  GC/CHLAMYDIA PROBE AMP, GENITAL   No results found.   1. Sexually transmitted disease (STD)       MDM   Patient with left lower quadrant abdominal pain for the last several days and vaginal discharge and itching that's been intermittent for a month. She denies any new sexual contacts but does not use protection. She denies any urinary symptoms. She has no signs of PID on exam and only mild vaginal discharge. No ovarian tenderness concerning for cysts or torsion. Patient's symptoms are not consistent with a kidney stone. UA with a large amount of leukocytes and 3-6 white blood cells. Wet prep shows  Many WBC and feel most likely this is STD.  Will treat with rocephin and azithro.        Gwyneth Sprout, MD 04/26/12 4098  Gwyneth Sprout, MD 04/26/12 1191

## 2012-04-26 NOTE — ED Notes (Signed)
Patient is resting comfortably. 

## 2012-04-27 LAB — GC/CHLAMYDIA PROBE AMP, GENITAL: Chlamydia, DNA Probe: NEGATIVE

## 2012-05-05 ENCOUNTER — Inpatient Hospital Stay (HOSPITAL_COMMUNITY)
Admission: AD | Admit: 2012-05-05 | Discharge: 2012-05-06 | Disposition: A | Discharge status: Home / Self Care | Payer: Self-pay | Source: Ambulatory Visit | Attending: Obstetrics & Gynecology | Admitting: Obstetrics & Gynecology

## 2012-05-05 DIAGNOSIS — A499 Bacterial infection, unspecified: Secondary | ICD-10-CM | POA: Insufficient documentation

## 2012-05-05 DIAGNOSIS — B9689 Other specified bacterial agents as the cause of diseases classified elsewhere: Secondary | ICD-10-CM | POA: Insufficient documentation

## 2012-05-05 DIAGNOSIS — N760 Acute vaginitis: Secondary | ICD-10-CM | POA: Insufficient documentation

## 2012-05-05 DIAGNOSIS — N949 Unspecified condition associated with female genital organs and menstrual cycle: Secondary | ICD-10-CM | POA: Insufficient documentation

## 2012-05-05 NOTE — MAU Note (Signed)
Patient states she wanted to get her discharge checked out. White and milky. No odor

## 2012-05-06 ENCOUNTER — Inpatient Hospital Stay (HOSPITAL_COMMUNITY)
Admission: AD | Admit: 2012-05-06 | Discharge: 2012-05-07 | Disposition: A | Discharge status: Home / Self Care | Payer: Self-pay | Source: Ambulatory Visit | Attending: Obstetrics and Gynecology | Admitting: Obstetrics and Gynecology

## 2012-05-06 ENCOUNTER — Encounter (HOSPITAL_COMMUNITY): Payer: Self-pay | Admitting: *Deleted

## 2012-05-06 DIAGNOSIS — Z113 Encounter for screening for infections with a predominantly sexual mode of transmission: Secondary | ICD-10-CM | POA: Insufficient documentation

## 2012-05-06 DIAGNOSIS — N760 Acute vaginitis: Secondary | ICD-10-CM

## 2012-05-06 DIAGNOSIS — A499 Bacterial infection, unspecified: Secondary | ICD-10-CM

## 2012-05-06 LAB — URINE MICROSCOPIC-ADD ON

## 2012-05-06 LAB — URINALYSIS, ROUTINE W REFLEX MICROSCOPIC
Glucose, UA: NEGATIVE mg/dL
Nitrite: NEGATIVE
Specific Gravity, Urine: 1.025 (ref 1.005–1.030)
pH: 6 (ref 5.0–8.0)

## 2012-05-06 LAB — WET PREP, GENITAL

## 2012-05-06 MED ORDER — METRONIDAZOLE 500 MG PO TABS: 500.0000 mg | ORAL_TABLET | Freq: Two times a day (BID) | ORAL | Status: AC

## 2012-05-06 NOTE — MAU Provider Note (Signed)
History     CSN: [id number]  Arrival date and time: 05/05/12 2332   First Provider Initiated Contact with Patient 05/06/12 0052      Chief Complaint  Patient presents with  . Vaginal Discharge   HPI This is a 26 year old female who presents with a two-week history of white vaginal discharge. Been worse over the past couple of days, which prompted her to present to the MAU. No provoking or palliating factors. She denies fevers, chills, nausea, vomiting. She does have mild cramping.  OB History    Grav Para Term Preterm Abortions TAB SAB Ect Mult Living   1 1 1              History reviewed. No pertinent past medical history.  History reviewed. No pertinent past surgical history.  History reviewed. No pertinent family history.  History  Substance Use Topics  . Smoking status: Not on file  . Smokeless tobacco: Not on file  . Alcohol Use: No    Allergies: No Known Allergies  No prescriptions prior to admission    ROS Physical Exam   Blood pressure 113/73, pulse 84, temperature 98.1 F (36.7 C), temperature source Oral, resp. rate 18, height 5\' 2"  (1.575 m), weight 54.545 kg (120 lb 4 oz), last menstrual period 04/25/2012.  Physical Exam  Constitutional: She is oriented to person, place, and time. She appears well-developed and well-nourished.  GI: Soft. She exhibits no distension and no mass. There is tenderness (Pelvic). There is no rebound and no guarding.  Genitourinary:       Normal external female genitalia. Normal vaginal mucosa. Cervix normal in appearance. Slight white vaginal discharge.  Neurological: She is alert and oriented to person, place, and time.  Skin: Skin is warm and dry.  Psychiatric: She has a normal mood and affect. Her behavior is normal. Judgment and thought content normal.   Results for orders placed during the hospital encounter of 05/05/12 (from the past 24 hour(s))  URINALYSIS, ROUTINE W REFLEX MICROSCOPIC     Status: Abnormal   Collection Time   05/05/12 11:40 PM      Component Value Range   Color, Urine YELLOW  YELLOW   APPearance CLEAR  CLEAR   Specific Gravity, Urine 1.025  1.005 - 1.030   pH 6.0  5.0 - 8.0   Glucose, UA NEGATIVE  NEGATIVE mg/dL   Hgb urine dipstick NEGATIVE  NEGATIVE   Bilirubin Urine NEGATIVE  NEGATIVE   Ketones, ur 15 (*) NEGATIVE mg/dL   Protein, ur NEGATIVE  NEGATIVE mg/dL   Urobilinogen, UA 1.0  0.0 - 1.0 mg/dL   Nitrite NEGATIVE  NEGATIVE   Leukocytes, UA TRACE (*) NEGATIVE  URINE MICROSCOPIC-ADD ON     Status: Normal   Collection Time   05/05/12 11:40 PM      Component Value Range   Squamous Epithelial / LPF RARE  RARE   WBC, UA 0-2  <3 WBC/hpf   RBC / HPF 0-2  <3 RBC/hpf   Urine-Other MUCOUS PRESENT    POCT PREGNANCY, URINE     Status: Normal   Collection Time   05/06/12 12:08 AM      Component Value Range   Preg Test, Ur NEGATIVE  NEGATIVE  WET PREP, GENITAL     Status: Abnormal   Collection Time   05/06/12 12:51 AM      Component Value Range   Yeast Wet Prep HPF POC NONE SEEN  NONE SEEN   Trich, Wet Prep NONE  SEEN  NONE SEEN   Clue Cells Wet Prep HPF POC FEW (*) NONE SEEN   WBC, Wet Prep HPF POC FEW (*) NONE SEEN    MAU Course  Procedures  Assessment and Plan  1.  BV  Flagyl 500mg  BID x 7 days.  Patient discharged to home.  Thurley Francesconi JEHIEL 05/06/2012, 12:55 AM
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
## 2013-01-17 ENCOUNTER — Ambulatory Visit (INDEPENDENT_AMBULATORY_CARE_PROVIDER_SITE_OTHER): Payer: Managed Care, Other (non HMO) | Admitting: Physician Assistant

## 2013-01-17 VITALS — BP 106/80 | HR 95 | Temp 99.0°F | Resp 16 | Ht 62.5 in | Wt 119.0 lb

## 2013-01-17 DIAGNOSIS — R5383 Other fatigue: Secondary | ICD-10-CM

## 2013-01-17 DIAGNOSIS — D649 Anemia, unspecified: Secondary | ICD-10-CM

## 2013-01-17 DIAGNOSIS — R5381 Other malaise: Secondary | ICD-10-CM

## 2013-01-17 DIAGNOSIS — N760 Acute vaginitis: Secondary | ICD-10-CM

## 2013-01-17 DIAGNOSIS — Z23 Encounter for immunization: Secondary | ICD-10-CM

## 2013-01-17 DIAGNOSIS — N9489 Other specified conditions associated with female genital organs and menstrual cycle: Secondary | ICD-10-CM

## 2013-01-17 DIAGNOSIS — Z0000 Encounter for general adult medical examination without abnormal findings: Secondary | ICD-10-CM

## 2013-01-17 DIAGNOSIS — Z202 Contact with and (suspected) exposure to infections with a predominantly sexual mode of transmission: Secondary | ICD-10-CM

## 2013-01-17 DIAGNOSIS — Z01419 Encounter for gynecological examination (general) (routine) without abnormal findings: Secondary | ICD-10-CM

## 2013-01-17 LAB — POCT URINALYSIS DIPSTICK
Bilirubin, UA: NEGATIVE
Ketones, UA: NEGATIVE
pH, UA: 8

## 2013-01-17 LAB — POCT CBC
Lymph, poc: 1.6 (ref 0.6–3.4)
MCH, POC: 28.4 pg (ref 27–31.2)
MCHC: 32.3 g/dL (ref 31.8–35.4)
MCV: 88 fL (ref 80–97)
MID (cbc): 0.4 (ref 0–0.9)
POC LYMPH PERCENT: 29.9 %L (ref 10–50)
Platelet Count, POC: 400 10*3/uL (ref 142–424)
RBC: 4.93 M/uL (ref 4.04–5.48)
RDW, POC: 12.6 %
WBC: 5.4 10*3/uL (ref 4.6–10.2)

## 2013-01-17 LAB — POCT UA - MICROSCOPIC ONLY
Casts, Ur, LPF, POC: NEGATIVE
Mucus, UA: NEGATIVE

## 2013-01-17 LAB — POCT WET PREP WITH KOH
KOH Prep POC: NEGATIVE
Trichomonas, UA: NEGATIVE

## 2013-01-17 MED ORDER — METRONIDAZOLE 500 MG PO TABS: 500.0000 mg | ORAL_TABLET | Freq: Two times a day (BID) | ORAL | Status: DC

## 2013-01-17 NOTE — Progress Notes (Signed)
  Subjective:    Patient ID: Katrina Barron, female    DOB: [birth year], 27 y.o.   MRN: [id number]  HPI    Review of Systems     Objective:   Physical Exam        Assessment & Plan:

## 2013-01-17 NOTE — Progress Notes (Signed)
Subjective:    Patient ID: Katrina Barron, female    DOB: 22-Aug-1986, 27 y.o.   MRN: [id number]  HPI 27 yr old AAF presents for CPE.  She c/o fatigue and just not feeling well.  She is in monogamous relationship for several years.  Last summer her fiance tested +for HSV (unsure if type 1 or 2). He hasn't had an outbreak.  She has what occasionally appears to be blisters on her labia and labia swelling. She has never had an abnormal pap. Review of Systems  All other systems reviewed and are negative.       Objective:   Physical Exam  Nursing note and vitals reviewed. Constitutional: She is oriented to person, place, and time. She appears well-developed and well-nourished.  HENT:  Head: Normocephalic and atraumatic.  Right Ear: External ear normal.  Left Ear: External ear normal.  Nose: Nose normal.  Mouth/Throat: No oropharyngeal exudate.  Eyes: Conjunctivae and EOM are normal. Pupils are equal, round, and reactive to light.  Neck: Normal range of motion. Neck supple. No thyromegaly present.  Cardiovascular: Normal rate, regular rhythm and normal heart sounds.   Pulmonary/Chest: Effort normal and breath sounds normal. Right breast exhibits no inverted nipple, no mass, no nipple discharge, no skin change and no tenderness. Left breast exhibits no inverted nipple, no mass, no nipple discharge, no skin change and no tenderness. Breasts are symmetrical.  Abdominal: Soft. Bowel sounds are normal. She exhibits no distension.  Genitourinary: Uterus normal. Vaginal discharge found.  Cervix is friable and bled easily. Unable to visualize cervix entirely to see IUD in place.  Musculoskeletal: Normal range of motion.  Lymphadenopathy:       Head (right side): No tonsillar, no preauricular and no occipital adenopathy present.       Head (left side): No tonsillar, no preauricular and no occipital adenopathy present.    She has no cervical adenopathy.    She has no axillary adenopathy.   Neurological: She is alert and oriented to person, place, and time. She displays normal reflexes. No cranial nerve deficit.  Skin: Skin is warm and dry.  Psychiatric: She has a normal mood and affect. Her behavior is normal.     Results for orders placed in visit on 01/17/13  POCT WET PREP WITH KOH      Result Value Range   Trichomonas, UA Negative     Clue Cells Wet Prep HPF POC 100%     Epithelial Wet Prep HPF POC 0-5     Yeast Wet Prep HPF POC neg     Bacteria Wet Prep HPF POC 3+     RBC Wet Prep HPF POC 1-2     WBC Wet Prep HPF POC 10-15     KOH Prep POC Negative    POCT CBC      Result Value Range   WBC 5.4  4.6 - 10.2 K/uL   Lymph, poc 1.6  0.6 - 3.4   POC LYMPH PERCENT 29.9  10 - 50 %L   MID (cbc) 0.4  0 - 0.9   POC MID % 6.9  0 - 12 %M   POC Granulocyte 3.4  2 - 6.9   Granulocyte percent 63.2  37 - 80 %G   RBC 4.93  4.04 - 5.48 M/uL   Hemoglobin 14.0  12.2 - 16.2 g/dL   HCT, POC 78.2  95.6 - 47.9 %   MCV 88.0  80 - 97 fL   MCH, POC 28.4  27 - 31.2 pg   MCHC 32.3  31.8 - 35.4 g/dL   RDW, POC 13.0     Platelet Count, POC 400  142 - 424 K/uL   MPV 8.8  0 - 99.8 fL  POCT URINALYSIS DIPSTICK      Result Value Range   Color, UA yellow     Clarity, UA clear     Glucose, UA neg     Bilirubin, UA neg     Ketones, UA neg     Spec Grav, UA 1.015     Blood, UA trace-intact     pH, UA 8.0     Protein, UA neg     Urobilinogen, UA 0.2     Nitrite, UA neg     Leukocytes, UA large (3+)    POCT UA - MICROSCOPIC ONLY      Result Value Range   WBC, Ur, HPF, POC 2-5     RBC, urine, microscopic 0-1     Bacteria, U Microscopic trace     Mucus, UA neg     Epithelial cells, urine per micros 0-1     Crystals, Ur, HPF, POC neg     Casts, Ur, LPF, POC neg     Yeast, UA neg        Assessment & Plan:  CPE Fatigue ?STD exposure-patient concerned for Herpes.  I will initiate suppression therapy if she is positive. BV Meds ordered this encounter  Medications  .  metroNIDAZOLE (FLAGYL) 500 MG tablet    Sig: Take 1 tablet (500 mg total) by mouth 2 (two) times daily.    Dispense:  14 tablet    Refill:  0    Order Specific Question:  Supervising Provider    Answer:  Tonye Pearson [[id number]]   Call with labs at [phone]

## 2013-01-17 NOTE — Progress Notes (Signed)
  Subjective:    Patient ID: Katrina Barron, female    DOB: [birth year], 27 y.o.   MRN: [id number]  HPI PMH, SH, SxHistory, etc reviewed.   Review of Systems     Objective:   Physical Exam        Assessment & Plan:

## 2013-01-17 NOTE — Patient Instructions (Addendum)
Preventive Care for Adults, Female A healthy lifestyle and preventive care can promote health and wellness. Preventive health guidelines for women include the following key practices.  A routine yearly physical is a good way to check with your caregiver about your health and preventive screening. It is a chance to share any concerns and updates on your health, and to receive a thorough exam.  Visit your dentist for a routine exam and preventive care every 6 months. Brush your teeth twice a day and floss once a day. Good oral hygiene prevents tooth decay and gum disease.  The frequency of eye exams is based on your age, health, family medical history, use of contact lenses, and other factors. Follow your caregiver's recommendations for frequency of eye exams.  Eat a healthy diet. Foods like vegetables, fruits, whole grains, low-fat dairy products, and lean protein foods contain the nutrients you need without too many calories. Decrease your intake of foods high in solid fats, added sugars, and salt. Eat the right amount of calories for you.Get information about a proper diet from your caregiver, if necessary.  Regular physical exercise is one of the most important things you can do for your health. Most adults should get at least 150 minutes of moderate-intensity exercise (any activity that increases your heart rate and causes you to sweat) each week. In addition, most adults need muscle-strengthening exercises on 2 or more days a week.  Maintain a healthy weight. The body mass index (BMI) is a screening tool to identify possible weight problems. It provides an estimate of body fat based on height and weight. Your caregiver can help determine your BMI, and can help you achieve or maintain a healthy weight.For adults 20 years and older:  A BMI below 18.5 is considered underweight.  A BMI of 18.5 to 24.9 is normal.  A BMI of 25 to 29.9 is considered overweight.  A BMI of 30 and above is  considered obese.  Maintain normal blood lipids and cholesterol levels by exercising and minimizing your intake of saturated fat. Eat a balanced diet with plenty of fruit and vegetables. Blood tests for lipids and cholesterol should begin at age 20 and be repeated every 5 years. If your lipid or cholesterol levels are high, you are over 50, or you are at high risk for heart disease, you may need your cholesterol levels checked more frequently.Ongoing high lipid and cholesterol levels should be treated with medicines if diet and exercise are not effective.  If you smoke, find out from your caregiver how to quit. If you do not use tobacco, do not start.  If you are pregnant, do not drink alcohol. If you are breastfeeding, be very cautious about drinking alcohol. If you are not pregnant and choose to drink alcohol, do not exceed 1 drink per day. One drink is considered to be 12 ounces (355 mL) of beer, 5 ounces (148 mL) of wine, or 1.5 ounces (44 mL) of liquor.  Avoid use of street drugs. Do not share needles with anyone. Ask for help if you need support or instructions about stopping the use of drugs.  High blood pressure causes heart disease and increases the risk of stroke. Your blood pressure should be checked at least every 1 to 2 years. Ongoing high blood pressure should be treated with medicines if weight loss and exercise are not effective.  If you are 55 to 27 years old, ask your caregiver if you should take aspirin to prevent strokes.  Diabetes   screening involves taking a blood sample to check your fasting blood sugar level. This should be done once every 3 years, after age 45, if you are within normal weight and without risk factors for diabetes. Testing should be considered at a younger age or be carried out more frequently if you are overweight and have at least 1 risk factor for diabetes.  Breast cancer screening is essential preventive care for women. You should practice "breast  self-awareness." This means understanding the normal appearance and feel of your breasts and may include breast self-examination. Any changes detected, no matter how small, should be reported to a caregiver. Women in their 20s and 30s should have a clinical breast exam (CBE) by a caregiver as part of a regular health exam every 1 to 3 years. After age 40, women should have a CBE every year. Starting at age 40, women should consider having a mammography (breast X-ray test) every year. Women who have a family history of breast cancer should talk to their caregiver about genetic screening. Women at a high risk of breast cancer should talk to their caregivers about having magnetic resonance imaging (MRI) and a mammography every year.  The Pap test is a screening test for cervical cancer. A Pap test can show cell changes on the cervix that might become cervical cancer if left untreated. A Pap test is a procedure in which cells are obtained and examined from the lower end of the uterus (cervix).  Women should have a Pap test starting at age 21.  Between ages 21 and 29, Pap tests should be repeated every 2 years.  Beginning at age 30, you should have a Pap test every 3 years as long as the past 3 Pap tests have been normal.  Some women have medical problems that increase the chance of getting cervical cancer. Talk to your caregiver about these problems. It is especially important to talk to your caregiver if a new problem develops soon after your last Pap test. In these cases, your caregiver may recommend more frequent screening and Pap tests.  The above recommendations are the same for women who have or have not gotten the vaccine for human papillomavirus (HPV).  If you had a hysterectomy for a problem that was not cancer or a condition that could lead to cancer, then you no longer need Pap tests. Even if you no longer need a Pap test, a regular exam is a good idea to make sure no other problems are  starting.  If you are between ages 65 and 70, and you have had normal Pap tests going back 10 years, you no longer need Pap tests. Even if you no longer need a Pap test, a regular exam is a good idea to make sure no other problems are starting.  If you have had past treatment for cervical cancer or a condition that could lead to cancer, you need Pap tests and screening for cancer for at least 20 years after your treatment.  If Pap tests have been discontinued, risk factors (such as a new sexual partner) need to be reassessed to determine if screening should be resumed.  The HPV test is an additional test that may be used for cervical cancer screening. The HPV test looks for the virus that can cause the cell changes on the cervix. The cells collected during the Pap test can be tested for HPV. The HPV test could be used to screen women aged 30 years and older, and should   be used in women of any age who have unclear Pap test results. After the age of 30, women should have HPV testing at the same frequency as a Pap test.  Colorectal cancer can be detected and often prevented. Most routine colorectal cancer screening begins at the age of 50 and continues through age 75. However, your caregiver may recommend screening at an earlier age if you have risk factors for colon cancer. On a yearly basis, your caregiver may provide home test kits to check for hidden blood in the stool. Use of a small camera at the end of a tube, to directly examine the colon (sigmoidoscopy or colonoscopy), can detect the earliest forms of colorectal cancer. Talk to your caregiver about this at age 50, when routine screening begins. Direct examination of the colon should be repeated every 5 to 10 years through age 75, unless early forms of pre-cancerous polyps or small growths are found.  Hepatitis C blood testing is recommended for all people born from 1945 through 1965 and any individual with known risks for hepatitis C.  Practice  safe sex. Use condoms and avoid high-risk sexual practices to reduce the spread of sexually transmitted infections (STIs). STIs include gonorrhea, chlamydia, syphilis, trichomonas, herpes, HPV, and human immunodeficiency virus (HIV). Herpes, HIV, and HPV are viral illnesses that have no cure. They can result in disability, cancer, and death. Sexually active women aged 25 and younger should be checked for chlamydia. Older women with new or multiple partners should also be tested for chlamydia. Testing for other STIs is recommended if you are sexually active and at increased risk.  Osteoporosis is a disease in which the bones lose minerals and strength with aging. This can result in serious bone fractures. The risk of osteoporosis can be identified using a bone density scan. Women ages 65 and over and women at risk for fractures or osteoporosis should discuss screening with their caregivers. Ask your caregiver whether you should take a calcium supplement or vitamin D to reduce the rate of osteoporosis.  Menopause can be associated with physical symptoms and risks. Hormone replacement therapy is available to decrease symptoms and risks. You should talk to your caregiver about whether hormone replacement therapy is right for you.  Use sunscreen with sun protection factor (SPF) of 30 or more. Apply sunscreen liberally and repeatedly throughout the day. You should seek shade when your shadow is shorter than you. Protect yourself by wearing long sleeves, pants, a wide-brimmed hat, and sunglasses year round, whenever you are outdoors.  Once a month, do a whole body skin exam, using a mirror to look at the skin on your back. Notify your caregiver of new moles, moles that have irregular borders, moles that are larger than a pencil eraser, or moles that have changed in shape or color.  Stay current with required immunizations.  Influenza. You need a dose every fall (or winter). The composition of the flu vaccine  changes each year, so being vaccinated once is not enough.  Pneumococcal polysaccharide. You need 1 to 2 doses if you smoke cigarettes or if you have certain chronic medical conditions. You need 1 dose at age 65 (or older) if you have never been vaccinated.  Tetanus, diphtheria, pertussis (Tdap, Td). Get 1 dose of Tdap vaccine if you are younger than age 65, are over 65 and have contact with an infant, are a healthcare worker, are pregnant, or simply want to be protected from whooping cough. After that, you need a Td   booster dose every 10 years. Consult your caregiver if you have not had at least 3 tetanus and diphtheria-containing shots sometime in your life or have a deep or dirty wound.  HPV. You need this vaccine if you are a woman age 26 or younger. The vaccine is given in 3 doses over 6 months.  Measles, mumps, rubella (MMR). You need at least 1 dose of MMR if you were born in 1957 or later. You may also need a second dose.  Meningococcal. If you are age 19 to 21 and a first-year college student living in a residence hall, or have one of several medical conditions, you need to get vaccinated against meningococcal disease. You may also need additional booster doses.  Zoster (shingles). If you are age 60 or older, you should get this vaccine.  Varicella (chickenpox). If you have never had chickenpox or you were vaccinated but received only 1 dose, talk to your caregiver to find out if you need this vaccine.  Hepatitis A. You need this vaccine if you have a specific risk factor for hepatitis A virus infection or you simply wish to be protected from this disease. The vaccine is usually given as 2 doses, 6 to 18 months apart.  Hepatitis B. You need this vaccine if you have a specific risk factor for hepatitis B virus infection or you simply wish to be protected from this disease. The vaccine is given in 3 doses, usually over 6 months. Preventive Services / Frequency Ages 19 to 39  Blood  pressure check.** / Every 1 to 2 years.  Lipid and cholesterol check.** / Every 5 years beginning at age 20.  Clinical breast exam.** / Every 3 years for women in their 20s and 30s.  Pap test.** / Every 2 years from ages 21 through 29. Every 3 years starting at age 30 through age 65 or 70 with a history of 3 consecutive normal Pap tests.  HPV screening.** / Every 3 years from ages 30 through ages 65 to 70 with a history of 3 consecutive normal Pap tests.  Hepatitis C blood test.** / For any individual with known risks for hepatitis C.  Skin self-exam. / Monthly.  Influenza immunization.** / Every year.  Pneumococcal polysaccharide immunization.** / 1 to 2 doses if you smoke cigarettes or if you have certain chronic medical conditions.  Tetanus, diphtheria, pertussis (Tdap, Td) immunization. / A one-time dose of Tdap vaccine. After that, you need a Td booster dose every 10 years.  HPV immunization. / 3 doses over 6 months, if you are 26 and younger.  Measles, mumps, rubella (MMR) immunization. / You need at least 1 dose of MMR if you were born in 1957 or later. You may also need a second dose.  Meningococcal immunization. / 1 dose if you are age 19 to 21 and a first-year college student living in a residence hall, or have one of several medical conditions, you need to get vaccinated against meningococcal disease. You may also need additional booster doses.  Varicella immunization.** / Consult your caregiver.  Hepatitis A immunization.** / Consult your caregiver. 2 doses, 6 to 18 months apart.  Hepatitis B immunization.** / Consult your caregiver. 3 doses usually over 6 months. Ages 40 to 64  Blood pressure check.** / Every 1 to 2 years.  Lipid and cholesterol check.** / Every 5 years beginning at age 20.  Clinical breast exam.** / Every year after age 40.  Mammogram.** / Every year beginning at age 40   and continuing for as long as you are in good health. Consult with your  caregiver.  Pap test.** / Every 3 years starting at age 30 through age 65 or 70 with a history of 3 consecutive normal Pap tests.  HPV screening.** / Every 3 years from ages 30 through ages 65 to 70 with a history of 3 consecutive normal Pap tests.  Fecal occult blood test (FOBT) of stool. / Every year beginning at age 50 and continuing until age 75. You may not need to do this test if you get a colonoscopy every 10 years.  Flexible sigmoidoscopy or colonoscopy.** / Every 5 years for a flexible sigmoidoscopy or every 10 years for a colonoscopy beginning at age 50 and continuing until age 75.  Hepatitis C blood test.** / For all people born from 1945 through 1965 and any individual with known risks for hepatitis C.  Skin self-exam. / Monthly.  Influenza immunization.** / Every year.  Pneumococcal polysaccharide immunization.** / 1 to 2 doses if you smoke cigarettes or if you have certain chronic medical conditions.  Tetanus, diphtheria, pertussis (Tdap, Td) immunization.** / A one-time dose of Tdap vaccine. After that, you need a Td booster dose every 10 years.  Measles, mumps, rubella (MMR) immunization. / You need at least 1 dose of MMR if you were born in 1957 or later. You may also need a second dose.  Varicella immunization.** / Consult your caregiver.  Meningococcal immunization.** / Consult your caregiver.  Hepatitis A immunization.** / Consult your caregiver. 2 doses, 6 to 18 months apart.  Hepatitis B immunization.** / Consult your caregiver. 3 doses, usually over 6 months. Ages 65 and over  Blood pressure check.** / Every 1 to 2 years.  Lipid and cholesterol check.** / Every 5 years beginning at age 20.  Clinical breast exam.** / Every year after age 40.  Mammogram.** / Every year beginning at age 40 and continuing for as long as you are in good health. Consult with your caregiver.  Pap test.** / Every 3 years starting at age 30 through age 65 or 70 with a 3  consecutive normal Pap tests. Testing can be stopped between 65 and 70 with 3 consecutive normal Pap tests and no abnormal Pap or HPV tests in the past 10 years.  HPV screening.** / Every 3 years from ages 30 through ages 65 or 70 with a history of 3 consecutive normal Pap tests. Testing can be stopped between 65 and 70 with 3 consecutive normal Pap tests and no abnormal Pap or HPV tests in the past 10 years.  Fecal occult blood test (FOBT) of stool. / Every year beginning at age 50 and continuing until age 75. You may not need to do this test if you get a colonoscopy every 10 years.  Flexible sigmoidoscopy or colonoscopy.** / Every 5 years for a flexible sigmoidoscopy or every 10 years for a colonoscopy beginning at age 50 and continuing until age 75.  Hepatitis C blood test.** / For all people born from 1945 through 1965 and any individual with known risks for hepatitis C.  Osteoporosis screening.** / A one-time screening for women ages 65 and over and women at risk for fractures or osteoporosis.  Skin self-exam. / Monthly.  Influenza immunization.** / Every year.  Pneumococcal polysaccharide immunization.** / 1 dose at age 65 (or older) if you have never been vaccinated.  Tetanus, diphtheria, pertussis (Tdap, Td) immunization. / A one-time dose of Tdap vaccine if you are over   65 and have contact with an infant, are a healthcare worker, or simply want to be protected from whooping cough. After that, you need a Td booster dose every 10 years.  Varicella immunization.** / Consult your caregiver.  Meningococcal immunization.** / Consult your caregiver.  Hepatitis A immunization.** / Consult your caregiver. 2 doses, 6 to 18 months apart.  Hepatitis B immunization.** / Check with your caregiver. 3 doses, usually over 6 months. ** Family history and personal history of risk and conditions may change your caregiver's recommendations. Document Released: 11/29/2001 Document Revised: 12/26/2011  Document Reviewed: 02/28/2011 [employer] Patient Information 2013 [employer].  

## 2013-01-18 LAB — HIV ANTIBODY (ROUTINE TESTING W REFLEX): HIV: REACTIVE

## 2013-01-18 LAB — RPR

## 2013-01-18 MED ORDER — ERGOCALCIFEROL 1.25 MG (50000 UT) PO CAPS: 50000.0000 [IU] | ORAL_CAPSULE | ORAL | Status: DC

## 2013-01-18 NOTE — Progress Notes (Addendum)
  Subjective:    Patient ID: Katrina Barron, female    DOB: [birth year], 27 y.o.   MRN: [id number]  HPI   n/a  Review of Systems n/a     Objective:   Physical Exam  n/a      Assessment & Plan:  N/a

## 2013-01-18 NOTE — Addendum Note (Signed)
Addended by: Anders Simmonds on: 01/18/2013 08:13 AM   Modules accepted: Orders

## 2013-01-21 ENCOUNTER — Telehealth: Payer: Self-pay

## 2013-01-21 LAB — PAP IG, CT-NG, RFX HPV ASCU
Chlamydia Probe Amp: NEGATIVE
GC Probe Amp: NEGATIVE

## 2013-01-21 NOTE — Telephone Encounter (Signed)
Pt states she received some lab results recently and is calling for the rest. Pt gave boyfriends number as good contact for the next couple of days. [phone]  bf

## 2013-01-21 NOTE — Telephone Encounter (Signed)
Labs not complete yet.

## 2013-01-22 NOTE — Telephone Encounter (Signed)
Labs complete, see lab patient has been advised.

## 2013-01-26 MED ORDER — VALACYCLOVIR HCL 1 G PO TABS: 1000.0000 mg | ORAL_TABLET | Freq: Every day | ORAL | Status: DC

## 2013-01-26 NOTE — Addendum Note (Signed)
Addended by: Anders Simmonds on: 01/26/2013 12:47 PM   Modules accepted: Orders

## 2013-01-29 ENCOUNTER — Encounter: Payer: Self-pay | Admitting: Family Medicine

## 2013-01-30 ENCOUNTER — Telehealth: Payer: Self-pay | Admitting: Family Medicine

## 2013-01-30 NOTE — Telephone Encounter (Signed)
Patient called back for lab results.  Was told to pick up rx for vit d and for herpes.  Patient stated that she understood and would pick up prescriptions today.

## 2018-06-27 ENCOUNTER — Ambulatory Visit: Payer: Self-pay | Admitting: Obstetrics & Gynecology

## 2020-03-29 ENCOUNTER — Ambulatory Visit
Admission: EM | Admit: 2020-03-29 | Discharge: 2020-03-29 | Disposition: A | Discharge status: Home / Self Care | Payer: Self-pay

## 2020-03-29 ENCOUNTER — Other Ambulatory Visit: Payer: Self-pay

## 2020-03-29 DIAGNOSIS — R109 Unspecified abdominal pain: Secondary | ICD-10-CM

## 2020-03-29 NOTE — Discharge Instructions (Signed)
No obvious causes of your symptoms. Please follow up with PCP for further evaluation. If having severe abdominal pain, nausea/vomiting, fever, go to the ED for further evaluation.

## 2020-03-29 NOTE — ED Triage Notes (Signed)
Patient is here for right lower abdominal discomfort that recurred one week ago. She states that her GYN advised her to try using an enema. She denies relief with this intervention.

## 2020-03-29 NOTE — ED Provider Notes (Signed)
[facility]    CSN: [id number] Arrival date & time: 03/29/20  1221      History   Chief Complaint Chief Complaint  Patient presents with  . Abdominal Pain    HPI Sala Wank is a 34 y.o. female.   34 year old female comes in for over a year history of abdominal discomfort, distention without any worsening symptoms.  States did a physical at another urgent care recently, and was told to take Fleet enema for possible constipation.  This has not improved her symptoms.  States she has had diffuse distention of the abdomen, with right lower quadrant bulging/swelling at times.  Pain to the bulging area that is pressure in sensation that occurs 2-3 times a week.  No obvious aggravating or alleviating factor.  Nausea intermittently that is associated when having pain.  Denies vomiting.  Does have BM every day with some straining and hard stools occasionally.  Denies fevers.  Denies urinary symptoms.  Has been having normal cycles monthly without much changes.  No current PCP/OB/GYN.  No history of abdominal surgeries.     Past Medical History:  Diagnosis Date  . Anemia     Patient Active Problem List   Diagnosis Date Noted  . Anemia 01/17/2013    History reviewed. No pertinent surgical history.  OB History    Gravida  1   Para  1   Term  1   Preterm      AB      Living        SAB      TAB      Ectopic      Multiple      Live Births               Home Medications    Prior to Admission medications   Medication Sig Start Date End Date Taking? Authorizing Provider  ergocalciferol (VITAMIN D2) 50000 UNITS capsule Take 1 capsule (50,000 Units total) by mouth once a week. 01/18/13   Argentina Donovan, PA-C  valACYclovir (VALTREX) 1000 MG tablet Take 1 tablet (1,000 mg total) by mouth daily. For prevention or 1/2 bid during outbreak X 3days 01/26/13   Argentina Donovan, PA-C    Family History Family History  Problem Relation Age of Onset  .  Sickle cell trait Son   . Cancer Maternal Grandmother   . Diabetes Maternal Grandfather     Social History Social History   Tobacco Use  . Smoking status: Never Smoker  . Smokeless tobacco: Never Used  Substance Use Topics  . Alcohol use: No  . Drug use: No     Allergies   Patient has no known allergies.   Review of Systems Review of Systems  Reason unable to perform ROS: See HPI as above.     Physical Exam Triage Vital Signs ED Triage Vitals  Enc Vitals Group     BP 03/29/20 1232 127/80     Pulse Rate 03/29/20 1232 84     Resp 03/29/20 1232 18     Temp 03/29/20 1232 98.3 F (36.8 C)     Temp Source 03/29/20 1232 Oral     SpO2 03/29/20 1232 100 %     Weight --      Height --      Head Circumference --      Peak Flow --      Pain Score 03/29/20 1242 7     Pain Loc --  Pain Edu? --      Excl. in [place]? --    No data found.  Updated Vital Signs BP 127/80 (BP Location: Left Arm)   Pulse 84   Temp 98.3 F (36.8 C) (Oral)   Resp 18   LMP 03/18/2020   SpO2 100%   Physical Exam Constitutional:      General: She is not in acute distress.    Appearance: She is well-developed. She is not ill-appearing, toxic-appearing or diaphoretic.  HENT:     Head: Normocephalic and atraumatic.  Eyes:     Conjunctiva/sclera: Conjunctivae normal.     Pupils: Pupils are equal, round, and reactive to light.  Cardiovascular:     Rate and Rhythm: Normal rate and regular rhythm.  Pulmonary:     Effort: Pulmonary effort is normal. No respiratory distress.     Comments: LCTAB Abdominal:     Comments: Abdomen is hard diffusely without guarding.  Hard to assess mass due to hardness through the abdomen.  Slight bulging to the right lower quadrant that is soft to palpation, no changes with straining/Valsalva maneuver. +BS throughout. No tenderness to palpation of the abdomen.  Musculoskeletal:     Cervical back: Normal range of motion and neck supple.  Skin:    General: Skin  is warm and dry.  Neurological:     Mental Status: She is alert and oriented to person, place, and time.  Psychiatric:        Behavior: Behavior normal.        Judgment: Judgment normal.      UC Treatments / Results  Labs (all labs ordered are listed, but only abnormal results are displayed) Labs Reviewed - No data to display  EKG   Radiology No results found.  Procedures Procedures (including critical care time)  Medications Ordered in UC Medications - No data to display  Initial Impression / Assessment and Plan / UC Course  I have reviewed the triage vital signs and the nursing notes.  Pertinent labs & imaging results that were available during my care of the patient were reviewed by me and considered in my medical decision making (see chart for details).    Patient's abdomen is diffusely hard to palpation, for which patient is unsure if this is baseline.  However, no tenderness to palpation, given 1 year history of symptoms without worsening, low suspicion for acute abdomen at this time.  Will refer to PCP for further evaluation and management needed.  Return precautions given.  Patient expresses understanding and agrees to plan.  Final Clinical Impressions(s) / UC Diagnoses   Final diagnoses:  Abdominal discomfort    ED Prescriptions    None     PDMP not reviewed this encounter.   Ok Edwards, PA-C 03/29/20 1539

## 2020-04-04 ENCOUNTER — Other Ambulatory Visit: Payer: Self-pay

## 2020-04-04 ENCOUNTER — Emergency Department (HOSPITAL_COMMUNITY)
Admission: EM | Admit: 2020-04-04 | Discharge: 2020-04-04 | Disposition: A | Discharge status: Home / Self Care | Payer: Self-pay | Attending: Emergency Medicine | Admitting: Emergency Medicine

## 2020-04-04 ENCOUNTER — Encounter (HOSPITAL_COMMUNITY): Payer: Self-pay | Admitting: Emergency Medicine

## 2020-04-04 DIAGNOSIS — Z5321 Procedure and treatment not carried out due to patient leaving prior to being seen by health care provider: Secondary | ICD-10-CM | POA: Insufficient documentation

## 2020-04-04 DIAGNOSIS — R109 Unspecified abdominal pain: Secondary | ICD-10-CM | POA: Insufficient documentation

## 2020-04-04 LAB — COMPREHENSIVE METABOLIC PANEL
ALT: 9 U/L (ref 0–44)
AST: 16 U/L (ref 15–41)
Albumin: 3.6 g/dL (ref 3.5–5.0)
Alkaline Phosphatase: 45 U/L (ref 38–126)
Anion gap: 8 (ref 5–15)
BUN: 10 mg/dL (ref 6–20)
CO2: 25 mmol/L (ref 22–32)
Calcium: 9.2 mg/dL (ref 8.9–10.3)
Chloride: 106 mmol/L (ref 98–111)
Creatinine, Ser: 0.95 mg/dL (ref 0.44–1.00)
GFR calc Af Amer: >60 mL/min (ref >60)
GFR calc non Af Amer: >60 mL/min (ref >60)
Glucose, Bld: 127 mg/dL — ABNORMAL HIGH (ref 70–99)
Potassium: 3.6 mmol/L (ref 3.5–5.1)
Sodium: 139 mmol/L (ref 135–145)
Total Bilirubin: 0.6 mg/dL (ref 0.3–1.2)
Total Protein: 6.6 g/dL (ref 6.5–8.1)

## 2020-04-04 LAB — CBC
HCT: 39.2 % (ref 36.0–46.0)
Hemoglobin: 12 g/dL (ref 12.0–15.0)
MCH: 26.8 pg (ref 26.0–34.0)
MCHC: 30.6 g/dL (ref 30.0–36.0)
MCV: 87.7 fL (ref 80.0–100.0)
Platelets: 348 10*3/uL (ref 150–400)
RBC: 4.47 MIL/uL (ref 3.87–5.11)
RDW: 18.1 % — ABNORMAL HIGH (ref 11.5–15.5)
WBC: 3.4 10*3/uL — ABNORMAL LOW (ref 4.0–10.5)
nRBC: 0 % (ref 0.0–0.2)

## 2020-04-04 LAB — I-STAT BETA HCG BLOOD, ED (MC, WL, AP ONLY): I-stat hCG, quantitative: <5 m[IU]/mL (ref <5)

## 2020-04-04 LAB — LIPASE, BLOOD: Lipase: 31 U/L (ref 11–51)

## 2020-04-04 MED ORDER — SODIUM CHLORIDE 0.9% FLUSH: 3.0000 mL | Freq: Once | INTRAVENOUS | Status: DC

## 2020-04-04 NOTE — ED Triage Notes (Addendum)
Pt presents to ED POV. Pt c/o "hard spot" on RLQ and RUQ. Pt states it been there for a "couple months." No bowel or bladder complaints. LMP 03/18/2020

## 2020-04-30 ENCOUNTER — Encounter: Payer: Self-pay | Admitting: Obstetrics and Gynecology

## 2020-05-12 ENCOUNTER — Other Ambulatory Visit: Payer: Self-pay | Admitting: Obstetrics and Gynecology

## 2020-05-12 DIAGNOSIS — D259 Leiomyoma of uterus, unspecified: Secondary | ICD-10-CM

## 2020-05-22 ENCOUNTER — Other Ambulatory Visit: Payer: Self-pay

## 2020-05-22 ENCOUNTER — Ambulatory Visit
Admission: RE | Admit: 2020-05-22 | Discharge: 2020-05-22 | Disposition: A | Discharge status: Home / Self Care | Payer: Managed Care, Other (non HMO) | Source: Ambulatory Visit | Attending: Obstetrics and Gynecology | Admitting: Obstetrics and Gynecology

## 2020-05-22 DIAGNOSIS — D259 Leiomyoma of uterus, unspecified: Secondary | ICD-10-CM

## 2020-05-22 IMAGING — MR MR PELVIS WO/W CM
7 of 11 series · 33 of 48 positions shown · IV contrast (12 ml Multihance)
Comparison: None.

CLINICAL DATA: Symptomatic uterine fibroids.  Anemia.

EXAM:
MRI PELVIS WITHOUT AND WITH CONTRAST
TECHNIQUE: Multiplanar multisequence MR imaging of the pelvis was performed
both before and after administration of intravenous contrast.
CONTRAST:  15mL MULTIHANCE GADOBENATE DIMEGLUMINE 529 MG/ML IV SOLN

[Series 2: cor haste · coronal · 6.0mm · 0.78mm/px · 3 of 30 slices shown]
[im 1/30]
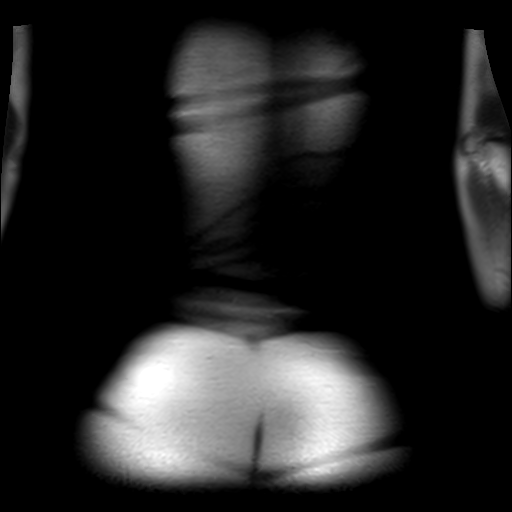
[im 15/30]
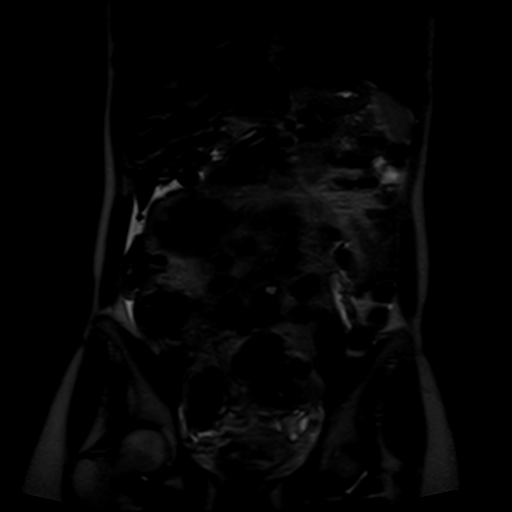
[im 30/30]
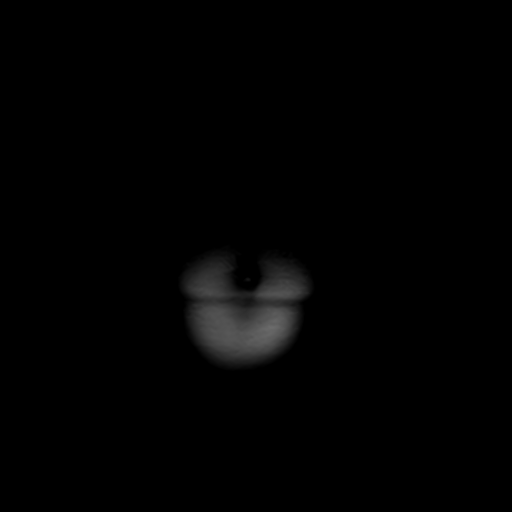

[Series 3: t2_tse_sag · sagittal · 5.0mm · 1.17mm/px · 5 of 38 slices shown]
[im 1/38]
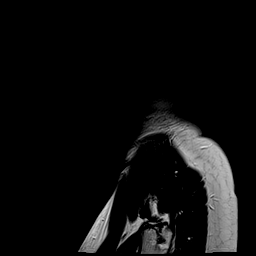
[im 10/38]
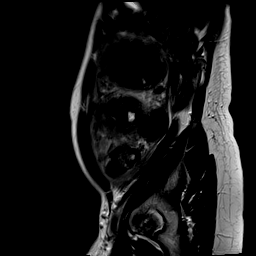
[im 19/38]
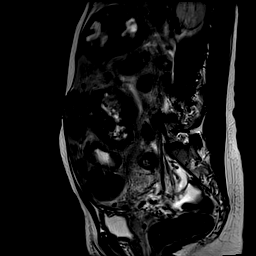
[im 28/38]
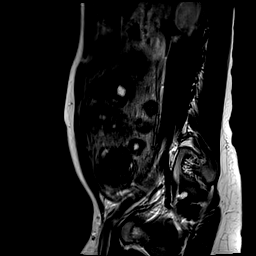
[im 38/38]
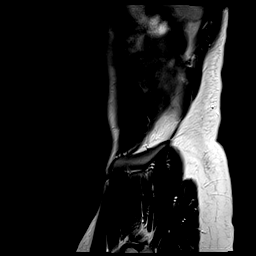

[Series 4: t2_tse axial fs · axial · 7.0mm · 1.30mm/px · z∈[-181,+129]mm · 5 of 35 slices shown]
[im 1/35]
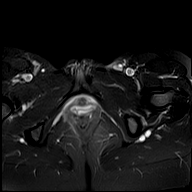
[im 9/35]
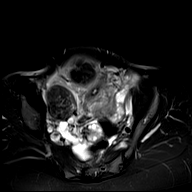
[im 18/35]
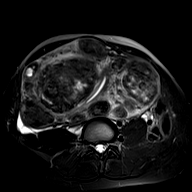
[im 26/35]
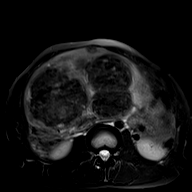
[im 35/35]
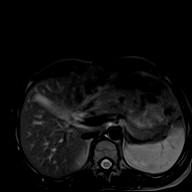

[Series 5: t2_tse axial · axial · 7.0mm · 0.98mm/px · z∈[-170,+121]mm · 5 of 33 slices shown]
[im 1/33]
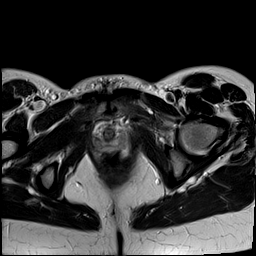
[im 9/33]
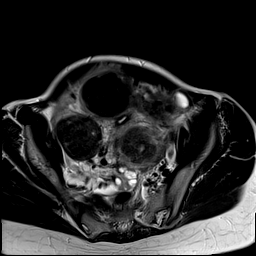
[im 17/33]
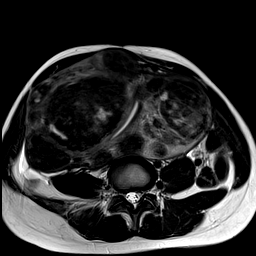
[im 25/33]
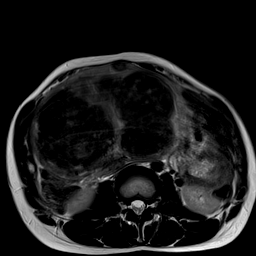
[im 33/33]
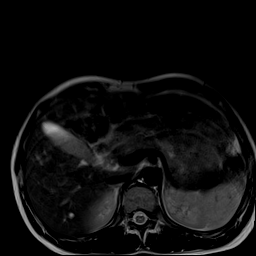

[Series 6: axial spgr · axial · 7.0mm · 0.98mm/px · z∈[-183,+117]mm · 5 of 34 slices shown]
[im 1/34]
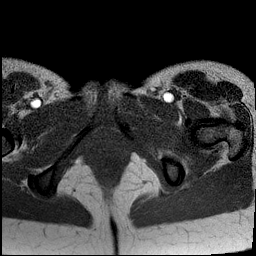
[im 9/34]
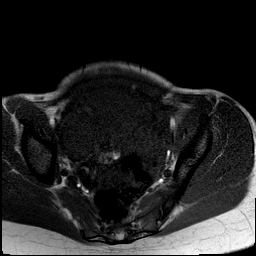
[im 17/34]
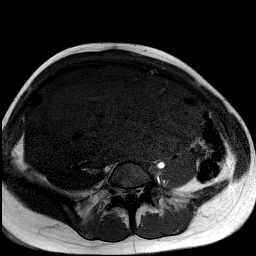
[im 25/34]
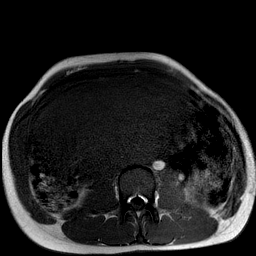
[im 34/34]
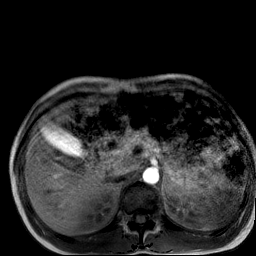

[Series 7: axial spgr pre · axial · non-contrast · 7.0mm · 0.49mm/px · z∈[-178,+113]mm · 5 of 33 slices shown]
[im 1/33]
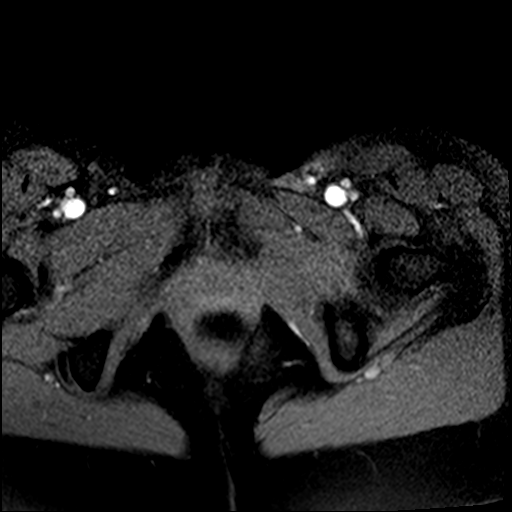
[im 9/33]
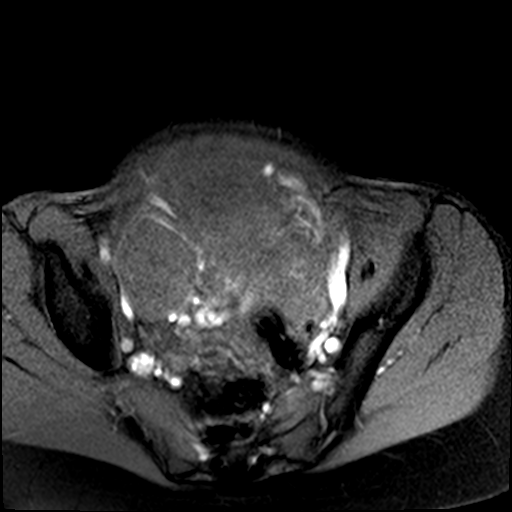
[im 17/33]
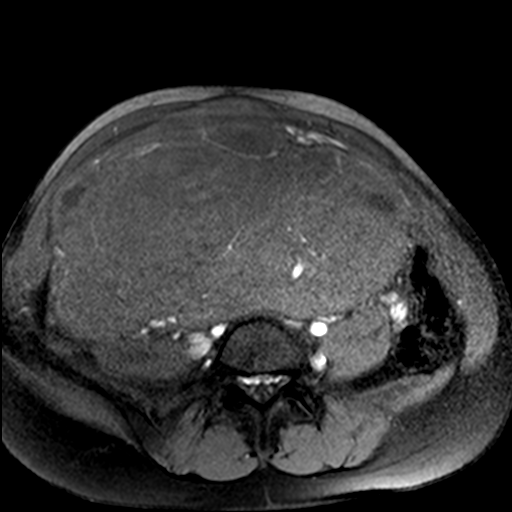
[im 25/33]
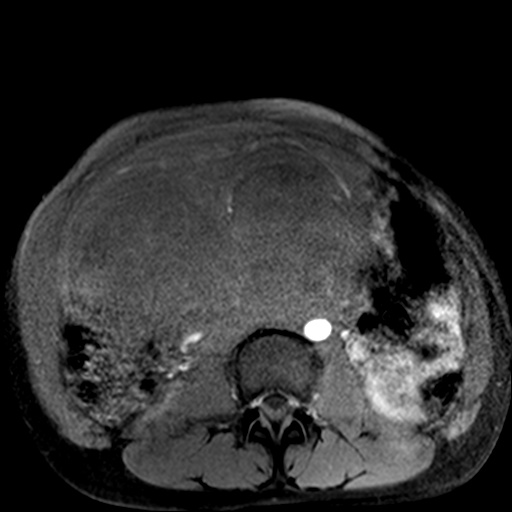
[im 33/33]
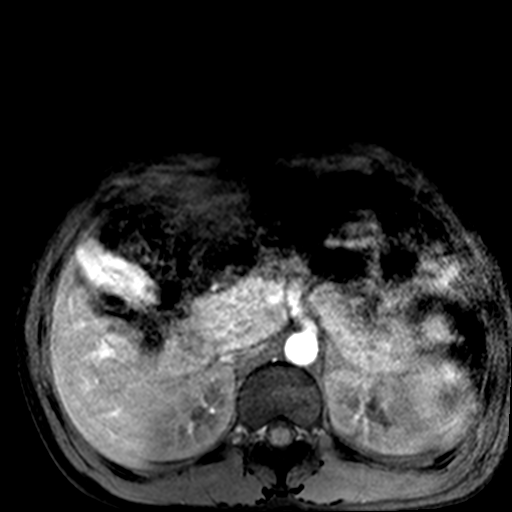

[Series 8: axial spgr post · axial · 7.0mm · 0.49mm/px · z∈[-178,+113]mm · 5 of 33 slices shown]
[im 1/33]
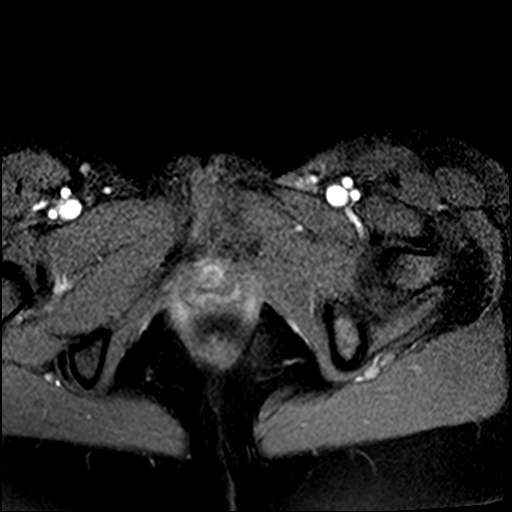
[im 9/33]
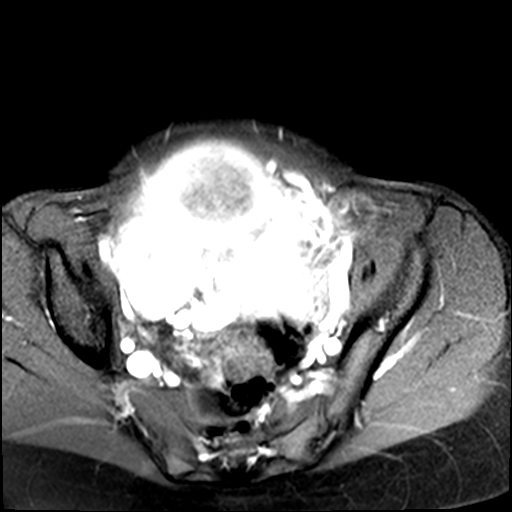
[im 17/33]
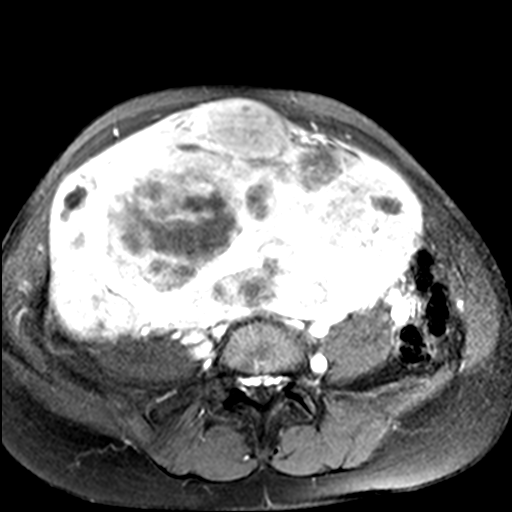
[im 25/33]
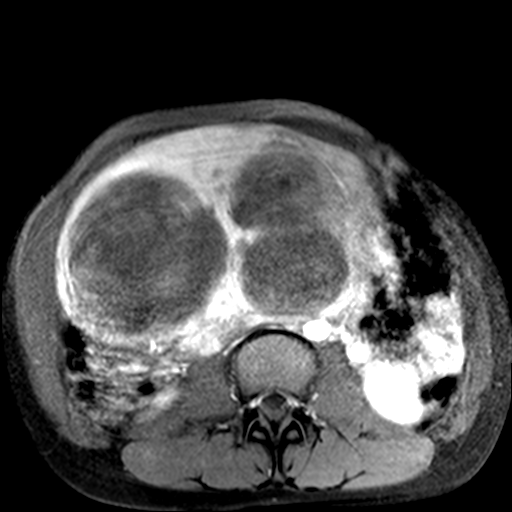
[im 33/33]
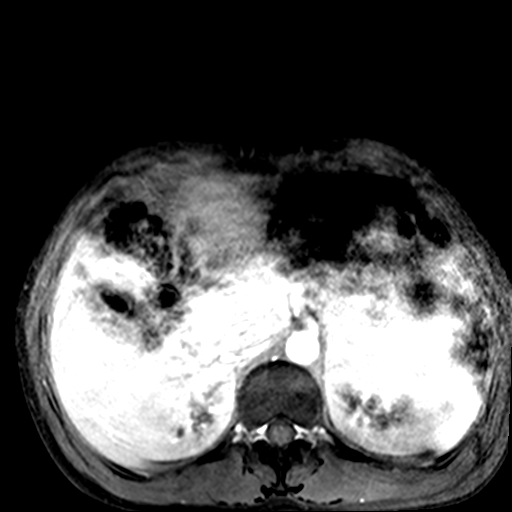

[33 of 48 positions shown; findings below may reference images not displayed]

FINDINGS: Lower Urinary Tract: No bladder or urethral abnormality identified.

Bowel:  Unremarkable visualized pelvic bowel loops.

Vascular/Lymphatic: No pathologically enlarged lymph nodes or other
significant abnormality.

Reproductive:

-- Uterus: Measures 26.3 x 10.5 x 18.3 cm (volume = [64] cm^3).
Numerous fibroids are seen which involve the uterus diffusely. These
are predominantly intramural location, with several small submucosal
and subserosal fibroids identified. These fibroids range in size
from less than 1 cm in diameter to the largest in the right lateral
corpus measuring 9.4 cm in maximum diameter.

-- Intracavitary fibroids:  None.

-- Pedunculated fibroids: None.

-- Fibroid contrast enhancement: All fibroids show diffuse
heterogeneous contrast enhancement, without significant
degeneration/devascularization.

-- Right ovary:  Appears normal.  No mass identified.

-- Left ovary:  Appears normal.  No mass identified.

Other: Tiny amount of free fluid in cul-de-sac.

Musculoskeletal:  Unremarkable.
IMPRESSION: Markedly enlarged uterus with diffuse involvement by numerous
fibroids, largest measuring 9.4 cm. No intracavitary or pedunculated
fibroids identified.

Normal appearance of both ovaries. No adnexal mass identified.

## 2020-05-22 MED ORDER — GADOBENATE DIMEGLUMINE 529 MG/ML IV SOLN
15.0000 mL | Freq: Once | INTRAVENOUS | Status: AC | PRN
  Administered 2020-05-22: 15 mL via INTRAVENOUS

## 2020-07-29 NOTE — Patient Instructions (Signed)
YOU ARE SCHEDULED FOR A COVID TEST 10/18_________@____________ . THIS TEST MUST BE DONE BEFORE SURGERY. GO TO  [facility]. [place], [place], IT IS APPROXIMATELY 2 MINUTES PAST [employer] ON THE RIGHT AND REMAIN IN YOUR CAR, THIS IS A DRIVE UP TEST. ONCE YOUR COVID TEST IS DONE PLEASE FOLLOW ALL THE QUARANTINE  INSTRUCTIONS GIVEN IN YOUR HANDOUT.      Your procedure is scheduled on 08/06/20   Report to [facility] AT 5:30  A. M.   Call this number if you have problems the morning of surgery  [phone].   OUR ADDRESS IS [address] [place].  WE ARE LOCATED IN [facility].  PLEASE BRING YOUR INSURANCE CARD AND PHOTO ID DAY OF SURGERY.  ONLY ONE PERSON ALLOWED IN FACILITY WAITING AREA.                                     REMEMBER:  DO NOT EAT FOOD OR DRINK LIQUIDS AFTER MIDNIGHT .    YOU MAY  BRUSH YOUR TEETH MORNING OF SURGERY AND RINSE YOUR MOUTH OUT, NO CHEWING GUM CANDY OR MINTS.   TAKE THESE MEDICATIONS MORNING OF SURGERY WITH A SIP OF WATER:  None  ONE VISITOR IS ALLOWED IN WAITING ROOM ONLY DAY OF SURGERY.    NO VISITOR MAY SPEND THE NIGHT.  VISITOR ARE ALLOWED TO STAY UNTIL 800 PM.                                    DO NOT WEAR JEWERLY, MAKE UP, OR NAIL POLISH ON FINGERNAILS.  DO NOT WEAR LOTIONS, POWDERS, PERFUMES OR DEODORANT.  DO NOT SHAVE FOR 24 HOURS PRIOR TO DAY OF SURGERY.  CONTACTS, GLASSES, OR DENTURES MAY NOT BE WORN TO SURGERY.                                    [facility] IS NOT RESPONSIBLE  FOR ANY BELONGINGS.                                                                    Marland Kitchen                                  [facility] - Preparing for Surgery Before surgery, you can play an important role.  Because skin is not sterile, your skin needs to be as free of germs as possible.  You can reduce the number of germs on your skin by washing with CHG (chlorahexidine gluconate) soap before surgery.  CHG is an  antiseptic cleaner which kills germs and bonds with the skin to continue killing germs even after washing. Please DO NOT use if you have an allergy to CHG or antibacterial soaps.  If your skin becomes reddened/irritated stop using the CHG and inform your nurse when you arrive at Short Stay. Do not shave (including legs and underarms) for at least 48  hours prior to the first CHG shower.  You may shave your face/neck. Please follow these instructions carefully:  1.  Shower with CHG Soap the night before surgery and the  morning of Surgery.  2.  If you choose to wash your hair, wash your hair first as usual with your  normal  shampoo.  3.  After you shampoo, rinse your hair and body thoroughly to remove the  shampoo.                                        4.  Use CHG as you would any other liquid soap.  You can apply chg directly  to the skin and wash                       Gently with a scrungie or clean washcloth.  5.  Apply the CHG Soap to your body ONLY FROM THE NECK DOWN.   Do not use on face/ open                           Wound or open sores. Avoid contact with eyes, ears mouth and genitals (private parts).                       Wash face,  Genitals (private parts) with your normal soap.             6.  Wash thoroughly, paying special attention to the area where your surgery  will be performed.  7.  Thoroughly rinse your body with warm water from the neck down.  8.  DO NOT shower/wash with your normal soap after using and rinsing off  the CHG Soap.                9.  Pat yourself dry with a clean towel.            10.  Wear clean pajamas.            11.  Place clean sheets on your bed the night of your first shower and do not  sleep with pets. Day of Surgery : Do not apply any lotions/deodorants the morning of surgery.  Please wear clean clothes to the hospital/surgery center.  FAILURE TO FOLLOW THESE INSTRUCTIONS MAY RESULT IN THE CANCELLATION OF YOUR SURGERY PATIENT  SIGNATURE_________________________________  NURSE SIGNATURE__________________________________  ________________________________________________________________________

## 2020-07-30 ENCOUNTER — Encounter (HOSPITAL_COMMUNITY)
Admission: RE | Admit: 2020-07-30 | Discharge: 2020-07-30 | Disposition: A | Discharge status: Home / Self Care | Payer: Managed Care, Other (non HMO) | Source: Ambulatory Visit | Attending: Obstetrics and Gynecology | Admitting: Obstetrics and Gynecology

## 2020-07-30 NOTE — Progress Notes (Signed)
I called Pt for PAT visit and she said that she had a lot going on and her son was sick so she is going to cancel her surgery.I told her to call Dr. Delanna Ahmadi office

## 2020-08-06 ENCOUNTER — Encounter (HOSPITAL_BASED_OUTPATIENT_CLINIC_OR_DEPARTMENT_OTHER): Admission: RE | Payer: Self-pay | Source: Home / Self Care

## 2020-08-06 ENCOUNTER — Ambulatory Visit (HOSPITAL_BASED_OUTPATIENT_CLINIC_OR_DEPARTMENT_OTHER)
Admission: RE | Admit: 2020-08-06 | Payer: Managed Care, Other (non HMO) | Source: Home / Self Care | Admitting: Obstetrics and Gynecology

## 2020-08-06 SURGERY — HYSTERECTOMY, TOTAL, ABDOMINAL, WITH SALPINGECTOMY
Anesthesia: General | Laterality: Bilateral

## 2020-12-15 ENCOUNTER — Telehealth: Payer: Self-pay | Admitting: *Deleted

## 2020-12-15 NOTE — Telephone Encounter (Signed)
Called the patient and scheduled a new patient appt for 3/3 at 9 am with Dr Denman George. Sent patient an email with the appt information

## 2020-12-16 ENCOUNTER — Encounter: Payer: Self-pay | Admitting: Gynecologic Oncology

## 2020-12-17 ENCOUNTER — Other Ambulatory Visit: Payer: Self-pay | Admitting: Gynecologic Oncology

## 2020-12-17 ENCOUNTER — Other Ambulatory Visit: Payer: Self-pay

## 2020-12-17 ENCOUNTER — Inpatient Hospital Stay: Payer: Managed Care, Other (non HMO)

## 2020-12-17 ENCOUNTER — Inpatient Hospital Stay: Payer: Managed Care, Other (non HMO) | Attending: Gynecologic Oncology | Admitting: Gynecologic Oncology

## 2020-12-17 VITALS — BP 124/90 | HR 96 | Temp 98.7°F | Resp 17 | Ht 63.0 in | Wt 121.4 lb

## 2020-12-17 DIAGNOSIS — D251 Intramural leiomyoma of uterus: Secondary | ICD-10-CM | POA: Insufficient documentation

## 2020-12-17 DIAGNOSIS — R140 Abdominal distension (gaseous): Secondary | ICD-10-CM | POA: Insufficient documentation

## 2020-12-17 DIAGNOSIS — N920 Excessive and frequent menstruation with regular cycle: Secondary | ICD-10-CM | POA: Diagnosis not present

## 2020-12-17 DIAGNOSIS — K5900 Constipation, unspecified: Secondary | ICD-10-CM | POA: Insufficient documentation

## 2020-12-17 DIAGNOSIS — D250 Submucous leiomyoma of uterus: Secondary | ICD-10-CM

## 2020-12-17 DIAGNOSIS — R109 Unspecified abdominal pain: Secondary | ICD-10-CM | POA: Diagnosis not present

## 2020-12-17 DIAGNOSIS — R350 Frequency of micturition: Secondary | ICD-10-CM | POA: Diagnosis not present

## 2020-12-17 LAB — CBC WITH DIFFERENTIAL (CANCER CENTER ONLY)
Abs Immature Granulocytes: 0.01 10*3/uL (ref 0.00–0.07)
Basophils Absolute: 0 10*3/uL (ref 0.0–0.1)
Basophils Relative: 1 %
Eosinophils Absolute: 0.2 10*3/uL (ref 0.0–0.5)
Eosinophils Relative: 4 %
HCT: 35.9 % — ABNORMAL LOW (ref 36.0–46.0)
Hemoglobin: 11.3 g/dL — ABNORMAL LOW (ref 12.0–15.0)
Immature Granulocytes: 0 %
Lymphocytes Relative: 35 %
Lymphs Abs: 1.3 10*3/uL (ref 0.7–4.0)
MCH: 27.2 pg (ref 26.0–34.0)
MCHC: 31.5 g/dL (ref 30.0–36.0)
MCV: 86.3 fL (ref 80.0–100.0)
Monocytes Absolute: 0.2 10*3/uL (ref 0.1–1.0)
Monocytes Relative: 5 %
Neutro Abs: 2.1 10*3/uL (ref 1.7–7.7)
Neutrophils Relative %: 55 %
Platelet Count: 343 10*3/uL (ref 150–400)
RBC: 4.16 MIL/uL (ref 3.87–5.11)
RDW: 14.3 % (ref 11.5–15.5)
WBC Count: 3.8 10*3/uL — ABNORMAL LOW (ref 4.0–10.5)
nRBC: 0 % (ref 0.0–0.2)

## 2020-12-17 LAB — COMPREHENSIVE METABOLIC PANEL
ALT: <6 U/L (ref 0–44)
AST: 15 U/L (ref 15–41)
Albumin: 3.7 g/dL (ref 3.5–5.0)
Alkaline Phosphatase: 73 U/L (ref 38–126)
Anion gap: 7 (ref 5–15)
BUN: 9 mg/dL (ref 6–20)
CO2: 25 mmol/L (ref 22–32)
Calcium: 9.5 mg/dL (ref 8.9–10.3)
Chloride: 106 mmol/L (ref 98–111)
Creatinine, Ser: 0.75 mg/dL (ref 0.44–1.00)
GFR, Estimated: >60 mL/min (ref >60)
Glucose, Bld: 86 mg/dL (ref 70–99)
Potassium: 4 mmol/L (ref 3.5–5.1)
Sodium: 138 mmol/L (ref 135–145)
Total Bilirubin: 0.6 mg/dL (ref 0.3–1.2)
Total Protein: 8.1 g/dL (ref 6.5–8.1)

## 2020-12-17 LAB — PREGNANCY, URINE: Preg Test, Ur: NEGATIVE

## 2020-12-17 MED ORDER — LEUPROLIDE ACETATE (3 MONTH) 11.25 MG IM KIT: 11.2500 mg | PACK | Freq: Once | INTRAMUSCULAR | Status: DC

## 2020-12-17 NOTE — Progress Notes (Signed)
Consult Note: Gyn-Onc  Consult was requested by Dr. Matthew Saras for the evaluation of Katrina Barron 35 y.o. female  CC:  Chief Complaint  Patient presents with  . Intramural and submucous leiomyoma of uterus    Assessment/Plan:  Katrina Barron  is a 35 y.o.  year old with a bulky, symptomatic fibroid uterus measuring 26cm.  Desires definitive treatment with hysterectomy. She understands that this will result in permanent infertility.  In order to optimize her surgical risk, likelihood of feasibility with minimally invasive surgery, and minimize risk for extreme blood loss, I am recommending a 28-month trial of Lupron to decrease the volume and bulk of her fibroids.  We will check CBC today to ensure she is not anemic and need iron supplementation.  I am checking a CMAC to ensure she has adequate nutritional status.  I counseled the patient about the anticipated risks of Lupron.  Given that we plan on only a 35-month injection.,  I have low suspicion that she will experience long-term sequelae such as bone loss or cardiovascular disease acceleration.  Based on my physical examination today, she is a high likelihood of being a candidate for a minimally invasive approach with mini laparotomy for specimen delivery and morcellation.  However at present visualization might be extremely limited, and Lupron may improve the odds of successful minimally invasive surgical approach.  I biopsied the endometrium today to rule out occult invasive carcinoma of the endometrium which would mean that controlled morcellation would not be recommended and would expedite the need for hysterectomy before three months.    HPI: Katrina Barron is a 35 year old P1 who was seen in consultation at the request of Dr Matthew Saras for evaluation of a bulky fibroid uterus.  The patient has had a bulky fibroid uterus for many years.  This causes mass-effect symptoms of abdominal discomfort and bloating, urinary frequency, and  constipation.  She also has menorrhagia, though has not required blood transfusions but does have a documented history of anemia.  The patient had been seen by Dr. Matthew Saras in 2021 and an ultrasound of the uterus was performed on 05/07/2020.  This revealed a uterus measuring 32.2 x 9.9 x 12.9 cm the left ovary was normal and the right ovary not visualized.  This was followed up with an MRI on May 22, 2020 which revealed a uterus measuring 26.3 x 10.5 x 18.3 cm with new numerous intramural, submucosal, and subserosal fibroids.  The ovaries were grossly normal.  She was scheduled for surgery with Dr. Dominga Ferry for an anticipated exploratory laparotomy with total abdominal hysterectomy for October 2021.  This surgery was canceled by the patient when she was unable to pay the payment plan preoperatively and desired waiting until the new year due to the holiday season and her son's impending birthday.  Her medical history is most significant for no prior hospitalizations or chronic medical conditions.  Her surgical history is most significant for no prior abdominal surgeries.  Her gynecologic history is remarkable for no history of abnormal pap smears. She has had a vaginal delivery.  Her family cancer history is unremarkable.  She works as a Administrator. She lives with her 17 year old child.   Current Meds:  Outpatient Encounter Medications as of 12/17/2020  Medication Sig  . Multiple Vitamins-Minerals (MULTIVITAMIN WOMEN PO) Take 1 tablet by mouth daily.  . Ferrous Sulfate (IRON PO) Take 1 tablet by mouth as needed. (Patient not taking: Reported on 12/16/2020)  . valACYclovir (VALTREX)  1000 MG tablet Take 1 tablet (1,000 mg total) by mouth daily. For prevention or 1/2 bid during outbreak X 3days (Patient not taking: Reported on 12/16/2020)   Facility-Administered Encounter Medications as of 12/17/2020  Medication  . leuprolide (LUPRON) injection 11.25 mg    Allergy: No Known Allergies  Social Hx:    Social History   Socioeconomic History  . Marital status: Single    Spouse name: Not on file  . Number of children: Not on file  . Years of education: Not on file  . Highest education level: Not on file  Occupational History  . 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 Smoking status: Never Smoker  . Smokeless tobacco: Never Used  Vaping Use  . Vaping Use: Never used  Substance and Sexual Activity  . Alcohol use: No  . Drug use: No  . Sexual activity: Not Currently    Birth control/protection: None  Other Topics Concern  . Not on file  Social History Narrative   Works as a Secretary/administrator, son was born in 2008 and has SST. She is in a monogamous relationship with her fiance.   Social Determinants of Health   Financial Resource Strain: Not on file  Food Insecurity: Not on file  Transportation Needs: Not on file  Physical Activity: Not on file  Stress: Not on file  Social Connections: Not on file  Intimate Partner Violence: Not on file    Past Surgical Hx: History reviewed. No pertinent surgical history.  Past Medical Hx:  Past Medical History:  Diagnosis Date  . Anemia   . Genital herpes simplex     Past Gynecological History:  See HPI, SVD x 1, fibroid uterus No LMP recorded.  Family Hx:  Family History  Problem Relation Age of Onset  . Sickle cell trait Son   . Cancer Maternal Grandmother   . Diabetes Maternal Grandfather   . Colon cancer Neg Hx   . Breast cancer Neg Hx   . Ovarian cancer Neg Hx   . Endometrial cancer Neg Hx   . Prostate cancer Neg Hx   . Pancreatic cancer Neg Hx     Review of Systems:  Constitutional  Feels well,    ENT Normal appearing ears and nares bilaterally Skin/Breast  No rash, sores, jaundice, itching, dryness Cardiovascular  No chest pain, shortness of breath, or edema  Pulmonary  No cough or wheeze.  Gastro Intestinal  No nausea, vomitting, or diarrhoea. No bright red blood per rectum,  no abdominal pain, change in bowel movement, +constipation.  Genito Urinary  No frequency, urgency, dysuria, + urinary frequency, + menorrhagia Musculo Skeletal  No myalgia, arthralgia, joint swelling or pain  Neurologic  No weakness, numbness, change in gait,  Psychology  No depression, anxiety, insomnia.   Vitals:  Blood pressure 124/90, pulse 96, temperature 98.7 F (37.1 C), temperature source Tympanic, resp. rate 17, height 5\' 3"  (1.6 m), weight 121 lb 6.4 oz (55.1 kg), SpO2 100 %.  Physical Exam: WD in NAD Neck  Supple NROM, without any enlargements.  Lymph Node Survey No cervical supraclavicular or inguinal adenopathy Cardiovascular  Well perfused peripheries Lungs  No increased WOB Skin  No rash/lesions/breakdown  Psychiatry  Alert and oriented to person, place, and time  Abdomen  Normoactive bowel sounds, abdomen soft, non-tender and thin without evidence of hernia. Large uterus filling the abdomen, minimally mobile, spans width of abdomen. Back No CVA tenderness Genito Urinary  Vulva/vagina: Normal  external female genitalia.  No lesions. No discharge or bleeding.  Bladder/urethra:  No lesions or masses, well supported bladder  Vagina: normal  Cervix: Normal appearing, no lesions.  Uterus:Bulky, mobile cervix and lower uterine segment, uterus sits relatively high in pelvis, uterus fills entire abdomen to close to xiphoid.   Adnexa: no discretely palpable masses. Rectal  Deferred  Extremities  No bilateral cyanosis, clubbing or edema.  Procedure Note:  Preop Dx: menorrhagia Postop Dx: same Procedure: endometrial biopsy Surgeon: Dorann Ou, MD EBL: <5cc Specimens: endometrium Complications: none Procedure Details: The patient provided verbal consent and a verbal timeout was performed.  The cervix was visualized with a speculum and an endometrial Pipelle was inserted to its full length into the large endometrial cavity.  It was aspirated.  2 passes from the  endometrium took place for a moderate amount of tissue.  There was also a lot of blood that was expressed from the endometrium as the patient was actively menstruating.  The specimen was sent for permanent pathology.  The patient tolerated the procedure well.   60 minutes of total time was spent for this patient encounter, including preparation, face-to-face counseling with the patient and coordination of care, review of imaging (results and images), communication with the referring provider and documentation of the encounter.   Thereasa Solo, MD  12/17/2020, 9:59 AM

## 2020-12-17 NOTE — Patient Instructions (Addendum)
Plan to have an MRI in two months and Dr. Denman George will see you in the office after to discuss.   Plan to have lab work today including a CBC to make sure you are not anemic and we will contact you with the results. We will also work on prior authorization for the lupron injection and contact you to arrange an appointment at the [facility] to get the injection.  Leuprolide depot injection What is this medicine? LEUPROLIDE (loo PROE lide) is a man-made protein that acts like a natural hormone in the body. It decreases testosterone in men and decreases estrogen in women. In women, some forms of this medicine may be used to treat endometriosis, uterine fibroids, or other female hormone-related problems. This medicine may be used for other purposes; ask your health care provider or pharmacist if you have questions. COMMON BRAND NAME(S): Eligard, Fensolv, Lupron Depot, Lupron Depot-Ped, Viadur What should I tell my health care provider before I take this medicine? They need to know if you have any of these conditions:  diabetes  heart disease or previous heart attack  high blood pressure  high cholesterol  mental illness  osteoporosis  pain or difficulty passing urine  seizures  spinal cord metastasis  stroke  suicidal thoughts, plans, or attempt; a previous suicide attempt by you or a family member  tobacco smoker  unusual vaginal bleeding (women)  an unusual or allergic reaction to leuprolide, benzyl alcohol, other medicines, foods, dyes, or preservatives  pregnant or trying to get pregnant  breast-feeding How should I use this medicine? This medicine is for injection into a muscle or for injection under the skin. It is given by a health care professional in a hospital or clinic setting. The specific product will determine how it will be given to you. Make sure you understand which product you receive and how often you will receive it. Talk to your pediatrician regarding the  use of this medicine in children. Special care may be needed. Overdosage: If you think you have taken too much of this medicine contact a poison control center or emergency room at once. NOTE: This medicine is only for you. Do not share this medicine with others. What if I miss a dose? It is important not to miss a dose. Call your doctor or health care professional if you are unable to keep an appointment. Depot injections: Depot injections are given either once-monthly, every 12 weeks, every 16 weeks, or every 24 weeks depending on the product you are prescribed. The product you are prescribed will be based on if you are female or female, and your condition. Make sure you understand your product and dosing. What may interact with this medicine? Do not take this medicine with any of the following medications:  chasteberry  cisapride  dronedarone  pimozide  thioridazine This medicine may also interact with the following medications:  herbal or dietary supplements, like black cohosh or DHEA  female hormones, like estrogens or progestins and birth control pills, patches, rings, or injections  female hormones, like testosterone  other medicines that prolong the QT interval (abnormal heart rhythm) This list may not describe all possible interactions. Give your health care provider a list of all the medicines, herbs, non-prescription drugs, or dietary supplements you use. Also tell them if you smoke, drink alcohol, or use illegal drugs. Some items may interact with your medicine. What should I watch for while using this medicine? Visit your doctor or health care professional for regular  checks on your progress. During the first weeks of treatment, your symptoms may get worse, but then will improve as you continue your treatment. You may get hot flashes, increased bone pain, increased difficulty passing urine, or an aggravation of nerve symptoms. Discuss these effects with your doctor or health  care professional, some of them may improve with continued use of this medicine. Female patients may experience a menstrual cycle or spotting during the first months of therapy with this medicine. If this continues, contact your doctor or health care professional. This medicine may increase blood sugar. Ask your healthcare provider if changes in diet or medicines are needed if you have diabetes. What side effects may I notice from receiving this medicine? Side effects that you should report to your doctor or health care professional as soon as possible:  allergic reactions like skin rash, itching or hives, swelling of the face, lips, or tongue  breathing problems  chest pain  depression or memory disorders  pain in your legs or groin  pain at site where injected or implanted  seizures  severe headache  signs and symptoms of high blood sugar such as being more thirsty or hungry or having to urinate more than normal. You may also feel very tired or have blurry vision  swelling of the feet and legs  suicidal thoughts or other mood changes  visual changes  vomiting Side effects that usually do not require medical attention (report to your doctor or health care professional if they continue or are bothersome):  breast swelling or tenderness  decrease in sex drive or performance  diarrhea  hot flashes  loss of appetite  muscle, joint, or bone pains  nausea  redness or irritation at site where injected or implanted  skin problems or acne This list may not describe all possible side effects. Call your doctor for medical advice about side effects. You may report side effects to FDA at 1-800-FDA-1088. Where should I keep my medicine? This drug is given in a hospital or clinic and will not be stored at home. NOTE: This sheet is a summary. It may not cover all possible information. If you have questions about this medicine, talk to your doctor, pharmacist, or health care  provider.  2021 Elsevier/Gold Standard (2019-09-04 10:35:13)

## 2020-12-21 LAB — SURGICAL PATHOLOGY

## 2020-12-22 ENCOUNTER — Telehealth: Payer: Self-pay

## 2020-12-22 NOTE — Telephone Encounter (Signed)
Told Ms Couchman that the endometrial biopsy showed no pre-cancer or cancer.  Benign findings per Melissa Cross,NP. Pt verbalized understanding.

## 2020-12-24 ENCOUNTER — Telehealth: Payer: Self-pay | Admitting: *Deleted

## 2020-12-24 NOTE — Telephone Encounter (Signed)
Patient called and left a message regarding a breast lump and wanted to see Dr Denman George

## 2020-12-24 NOTE — Telephone Encounter (Signed)
L/M for pt to call back to the office to discuss the breast lump.

## 2020-12-24 NOTE — Telephone Encounter (Signed)
Told Ms Dusing that Zoila Shutter said that she needed to see her regular gyn Dr. Matthew Saras or call back to [facility] and speak with Ramon Dredge, NPwho she saw on 12-15-20 for the pain in breast.  Ms Eisen stated that she will call Dr. Delanna Ahmadi office.

## 2020-12-28 ENCOUNTER — Telehealth: Payer: Self-pay

## 2020-12-28 NOTE — Telephone Encounter (Signed)
Patient brought in [place] forms needed for pharmacy assistance. Juan Quam with pharmacy notified.  Forms placed in pharmacy/chemo IV auth folder as requested.

## 2020-12-28 NOTE — Telephone Encounter (Signed)
TC to inform patient of lab results from 12/17/2020.  No need for iron or supplement at this time.  Patient encouraged to eat leafy green vegetables and high iron content foods to help level stay stable.  No further questions or concerns at this time.

## 2021-01-05 ENCOUNTER — Telehealth: Payer: Self-pay

## 2021-01-05 DIAGNOSIS — D250 Submucous leiomyoma of uterus: Secondary | ICD-10-CM

## 2021-01-05 NOTE — Telephone Encounter (Signed)
Katrina Barron's Lupron assistance was approved.  Scheduled her for a urine pregnancy test and Lupron injection for 01-15-21. Pt verbalized understanding.

## 2021-01-05 NOTE — Telephone Encounter (Signed)
Spoke with Ms Katrina Barron and gave her appointments as noted below and scheduled a visit after the MRI on 03-19-21 for 03-25-21 at 3:30. Ms Katrina Barron needs Thursday /Friday appointments due to her work schedule.

## 2021-01-05 NOTE — Telephone Encounter (Signed)
Pt just receive approval for assistance for Lupron injection. Medication available ~01-08-21. Katrina Barron could not come in for injection until 01-15-21. R/S MRI to 03-19-21  to be 2 months from Lupron injection. LM for her to call and discuss new MRI appointment and r/s visit with Dr. Denman Barron to after 03-19-21.

## 2021-01-05 NOTE — Progress Notes (Signed)
..  The following Medication: Lupron has been approved thru [employer] as Assistance Program. Enrollment period is 01/04/2021 to 01/01/2022.  Reason for Assistance: Insurance Denial First DOS: TBD

## 2021-01-15 ENCOUNTER — Inpatient Hospital Stay: Payer: Managed Care, Other (non HMO) | Attending: Gynecologic Oncology

## 2021-01-15 ENCOUNTER — Inpatient Hospital Stay: Payer: Managed Care, Other (non HMO)

## 2021-01-15 ENCOUNTER — Other Ambulatory Visit: Payer: Self-pay

## 2021-01-15 VITALS — BP 115/82 | HR 84 | Temp 98.4°F | Resp 16

## 2021-01-15 DIAGNOSIS — Z79818 Long term (current) use of other agents affecting estrogen receptors and estrogen levels: Secondary | ICD-10-CM | POA: Insufficient documentation

## 2021-01-15 DIAGNOSIS — D250 Submucous leiomyoma of uterus: Secondary | ICD-10-CM | POA: Diagnosis present

## 2021-01-15 DIAGNOSIS — Z3202 Encounter for pregnancy test, result negative: Secondary | ICD-10-CM | POA: Diagnosis not present

## 2021-01-15 DIAGNOSIS — D251 Intramural leiomyoma of uterus: Secondary | ICD-10-CM | POA: Insufficient documentation

## 2021-01-15 LAB — PREGNANCY, URINE: Preg Test, Ur: NEGATIVE

## 2021-01-15 MED ORDER — LEUPROLIDE ACETATE (3 MONTH) 22.5 MG IM KIT
11.2500 mg | PACK | Freq: Once | INTRAMUSCULAR | Status: AC
  Administered 2021-01-15: 11.25 mg via INTRAMUSCULAR
  Filled 2021-01-15: qty 22.5

## 2021-01-15 NOTE — Patient Instructions (Signed)
Leuprolide injection What is this medicine? LEUPROLIDE (loo PROE lide) is a man-made hormone. It is used to treat the symptoms of prostate cancer. This medicine may also be used to treat children with early onset of puberty. It may be used for other hormonal conditions. This medicine may be used for other purposes; ask your health care provider or pharmacist if you have questions. COMMON BRAND NAME(S): Lupron What should I tell my health care provider before I take this medicine? They need to know if you have any of these conditions:  diabetes  heart disease or previous heart attack  high blood pressure  high cholesterol  pain or difficulty passing urine  spinal cord metastasis  stroke  tobacco smoker  an unusual or allergic reaction to leuprolide, benzyl alcohol, other medicines, foods, dyes, or preservatives  pregnant or trying to get pregnant  breast-feeding How should I use this medicine? This medicine is for injection under the skin or into a muscle. You will be taught how to prepare and give this medicine. Use exactly as directed. Take your medicine at regular intervals. Do not take your medicine more often than directed. It is important that you put your used needles and syringes in a special sharps container. Do not put them in a trash can. If you do not have a sharps container, call your pharmacist or healthcare provider to get one. A special MedGuide will be given to you by the pharmacist with each prescription and refill. Be sure to read this information carefully each time. Talk to your pediatrician regarding the use of this medicine in children. While this medicine may be prescribed for children as young as 8 years for selected conditions, precautions do apply. Overdosage: If you think you have taken too much of this medicine contact a poison control center or emergency room at once. NOTE: This medicine is only for you. Do not share this medicine with others. What if  I miss a dose? If you miss a dose, take it as soon as you can. If it is almost time for your next dose, take only that dose. Do not take double or extra doses. What may interact with this medicine? Do not take this medicine with any of the following medications:  chasteberry  cisapride  dronedarone  pimozide  thioridazine This medicine may also interact with the following medications:  herbal or dietary supplements, like black cohosh or DHEA  female hormones, like estrogens or progestins and birth control pills, patches, rings, or injections  female hormones, like testosterone  other medicines that prolong the QT interval (abnormal heart rhythm) This list may not describe all possible interactions. Give your health care provider a list of all the medicines, herbs, non-prescription drugs, or dietary supplements you use. Also tell them if you smoke, drink alcohol, or use illegal drugs. Some items may interact with your medicine. What should I watch for while using this medicine? Visit your doctor or health care professional for regular checks on your progress. During the first week, your symptoms may get worse, but then will improve as you continue your treatment. You may get hot flashes, increased bone pain, increased difficulty passing urine, or an aggravation of nerve symptoms. Discuss these effects with your doctor or health care professional, some of them may improve with continued use of this medicine. Female patients may experience a menstrual cycle or spotting during the first 2 months of therapy with this medicine. If this continues, contact your doctor or health care professional.   This medicine may increase blood sugar. Ask your healthcare provider if changes in diet or medicines are needed if you have diabetes. What side effects may I notice from receiving this medicine? Side effects that you should report to your doctor or health care professional as soon as possible:  allergic  reactions like skin rash, itching or hives, swelling of the face, lips, or tongue  breathing problems  chest pain  depression or memory disorders  pain in your legs or groin  pain at site where injected  severe headache  signs and symptoms of high blood sugar such as being more thirsty or hungry or having to urinate more than normal. You may also feel very tired or have blurry vision  swelling of the feet and legs  visual changes  vomiting Side effects that usually do not require medical attention (report to your doctor or health care professional if they continue or are bothersome):  breast swelling or tenderness  decrease in sex drive or performance  diarrhea  hot flashes  loss of appetite  muscle, joint, or bone pains  nausea  redness or irritation at site where injected  skin problems or acne This list may not describe all possible side effects. Call your doctor for medical advice about side effects. You may report side effects to FDA at 1-800-FDA-1088. Where should I keep my medicine? Keep out of the reach of children. Store below 25 degrees C (77 degrees F). Do not freeze. Protect from light. Do not use if it is not clear or if there are particles present. Throw away any unused medicine after the expiration date. NOTE: This sheet is a summary. It may not cover all possible information. If you have questions about this medicine, talk to your doctor, pharmacist, or health care provider.  2021 Elsevier/Gold Standard (2019-09-04 10:57:41)  

## 2021-02-10 ENCOUNTER — Encounter (HOSPITAL_BASED_OUTPATIENT_CLINIC_OR_DEPARTMENT_OTHER): Payer: Self-pay

## 2021-02-10 ENCOUNTER — Ambulatory Visit (HOSPITAL_BASED_OUTPATIENT_CLINIC_OR_DEPARTMENT_OTHER): Admit: 2021-02-10 | Payer: Managed Care, Other (non HMO) | Admitting: Obstetrics and Gynecology

## 2021-02-10 SURGERY — HYSTERECTOMY, TOTAL, ABDOMINAL, WITH SALPINGECTOMY
Anesthesia: Choice | Laterality: Bilateral

## 2021-02-11 ENCOUNTER — Telehealth: Payer: Self-pay

## 2021-02-11 NOTE — Telephone Encounter (Signed)
Katrina Barron had her first Lupron Injection for her fibroids on 01-15-21. She has been experiencing a grayish watery discharge with a strong foul odor.  She wants to know if this is a normal  side effect from the Lupron injection. Told her that Katrina Barron said that the discharge can be from degenerative fibroids.  The lupron is to help shrink her fibroids. Suggested a charcoal shoe insert to be placed below a panty liner or light pad to help control the odor. Reviewed MRI and office visit appointment dates and time with Katrina Barron. Pt verbalized understanding.

## 2021-02-16 ENCOUNTER — Other Ambulatory Visit (HOSPITAL_COMMUNITY): Payer: Managed Care, Other (non HMO)

## 2021-02-18 ENCOUNTER — Ambulatory Visit: Payer: Managed Care, Other (non HMO) | Admitting: Gynecologic Oncology

## 2021-03-19 ENCOUNTER — Ambulatory Visit (HOSPITAL_COMMUNITY)
Admission: RE | Admit: 2021-03-19 | Discharge: 2021-03-19 | Disposition: A | Discharge status: Home / Self Care | Payer: Managed Care, Other (non HMO) | Source: Ambulatory Visit | Attending: Gynecologic Oncology | Admitting: Gynecologic Oncology

## 2021-03-19 ENCOUNTER — Other Ambulatory Visit: Payer: Self-pay

## 2021-03-19 DIAGNOSIS — D250 Submucous leiomyoma of uterus: Secondary | ICD-10-CM | POA: Diagnosis present

## 2021-03-19 DIAGNOSIS — D251 Intramural leiomyoma of uterus: Secondary | ICD-10-CM | POA: Diagnosis present

## 2021-03-19 IMAGING — MR MR PELVIS WO/W CM
27 of 29 series · 46 of 48 positions shown · IV contrast (gadavist)
Comparison: [DATE]

CLINICAL DATA: Uterine fibroids.  Surgical planning.

EXAM:
MRI PELVIS WITHOUT AND WITH CONTRAST
TECHNIQUE: Multiplanar multisequence MR imaging of the pelvis was performed
both before and after administration of intravenous contrast.
CONTRAST:  6mL GADAVIST GADOBUTROL 1 MMOL/ML IV SOLN

[Series 2: T2 · coronal · 6.0mm · 1.56mm/px · 1 of 30 slices shown (1 of 4)]
[im 1/30]
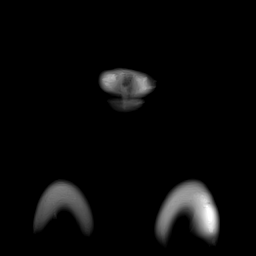

[Series 3: T2 · axial · 5.0mm · 0.51mm/px · 1 of 34 slices shown (2 of 4)]
[im 1/34]
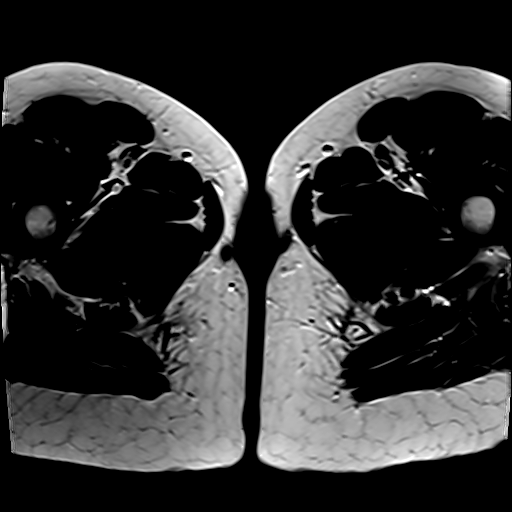

[Series 4: T2 · coronal · 4.0mm · 0.51mm/px · 1 of 34 slices shown (3 of 4)]
[im 1/34]
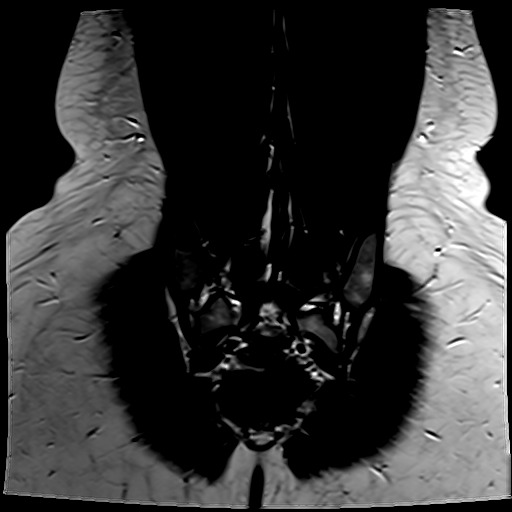

[Series 5: T2 fat-sat · axial · 5.0mm · 0.51mm/px · 1 of 48 slices shown]
[im 1/48]
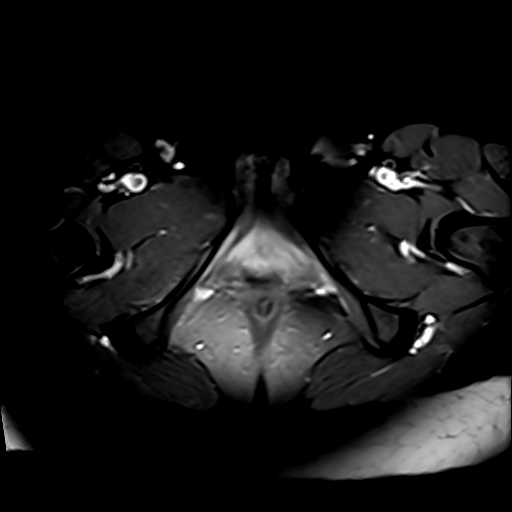

[Series 6: T2 · sagittal · 5.0mm · 0.59mm/px · 1 of 40 slices shown (4 of 4)]
[im 1/40]
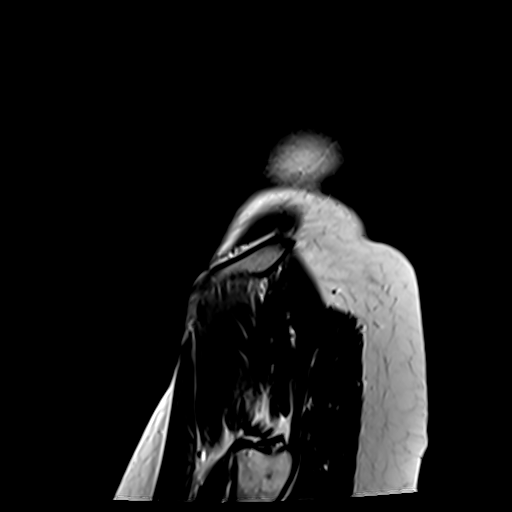

[Series 7: T1 · axial · 4.0mm · 0.84mm/px · 1 of 80 slices shown (1 of 2)]
[im 1/80]
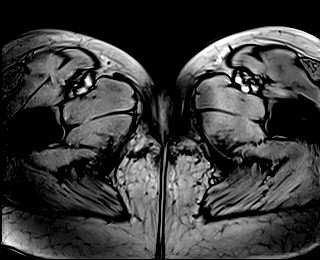

[Series 8: T1 · axial · 4.0mm · 0.84mm/px · 1 of 80 slices shown (2 of 2)]
[im 1/80]
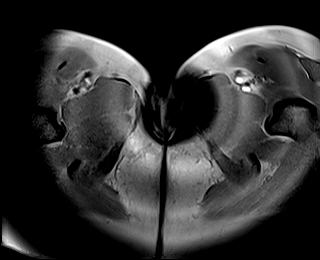

[Series 9: DWI · axial · 6.0mm · 1.49mm/px · z∈[-48,+204]mm · 2 of 72 slices shown (1 of 2)]
[im 1/72]
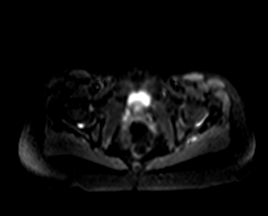
[im 72/72]
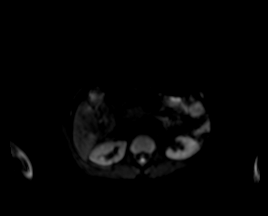

[Series 10: DWI · axial · 6.0mm · 1.49mm/px · 1 of 36 slices shown (2 of 2)]
[im 1/36]
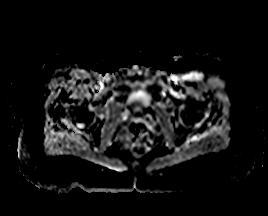

[Series 11: T1 dynamic · axial · 3.0mm · 0.84mm/px · z∈[-86,+199]mm · 2 of 96 slices shown (1 of 14)]
[im 1/96]
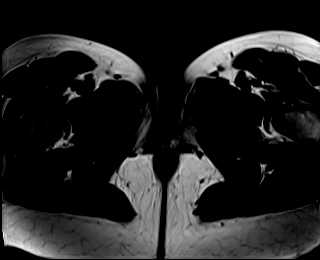
[im 96/96]
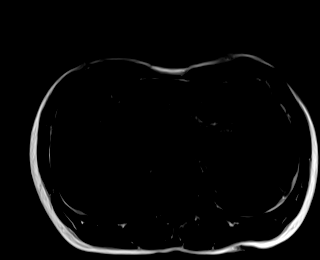

[Series 12: T1 dynamic · axial · 3.0mm · 0.84mm/px · z∈[-86,+199]mm · 2 of 96 slices shown (2 of 14)]
[im 1/96]
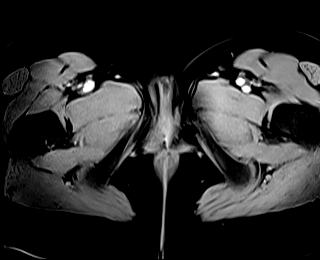
[im 96/96]
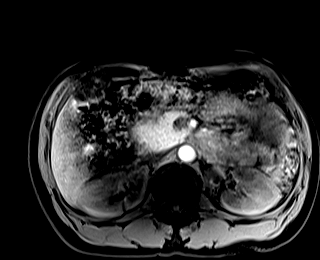

[Series 14: T1 dynamic · axial · 3.0mm · 0.84mm/px · z∈[-86,+199]mm · 2 of 96 slices shown (3 of 14)]
[im 1/96]
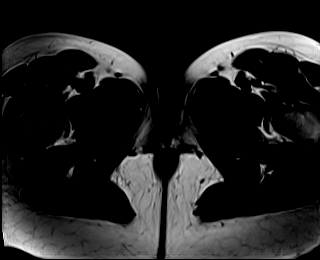
[im 96/96]
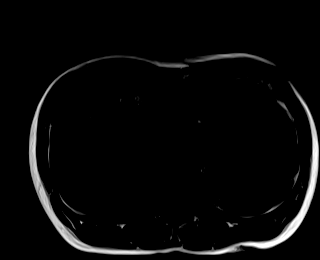

[Series 15: T1 dynamic · axial · 3.0mm · 0.84mm/px · z∈[-86,+199]mm · 2 of 93 slices shown (4 of 14)]
[im 1/93]
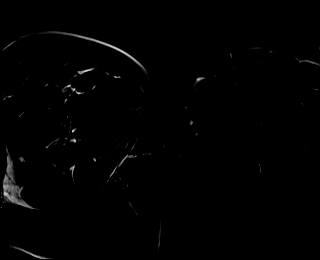
[im 93/93]
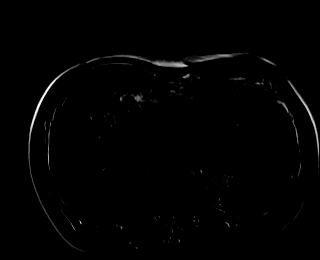

[Series 16: T1 dynamic · axial · 3.0mm · 0.84mm/px · z∈[-86,+199]mm · 2 of 96 slices shown (5 of 14)]
[im 1/96]
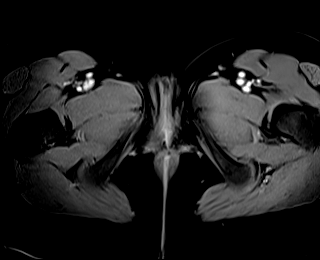
[im 96/96]
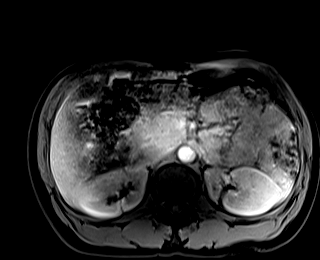

[Series 17: T1 dynamic · axial · 3.0mm · 0.84mm/px · z∈[-86,+199]mm · 2 of 96 slices shown (6 of 14)]
[im 1/96]
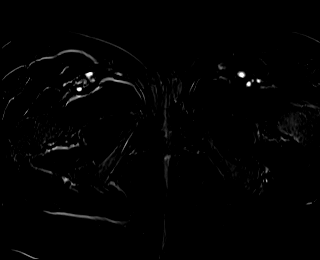
[im 96/96]
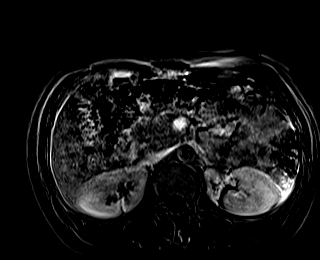

[Series 18: T1 dynamic · axial · 3.0mm · 0.84mm/px · z∈[-86,+199]mm · 2 of 96 slices shown (7 of 14)]
[im 1/96]
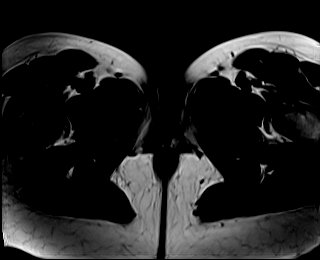
[im 96/96]
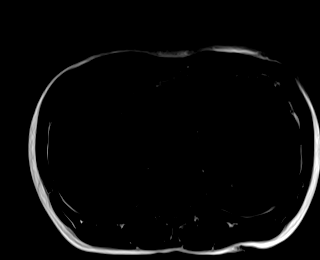

[Series 19: T1 dynamic · axial · 3.0mm · 0.84mm/px · z∈[-86,+199]mm · 2 of 93 slices shown (8 of 14)]
[im 1/93]
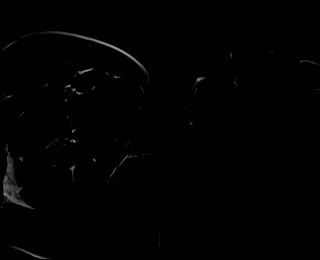
[im 93/93]
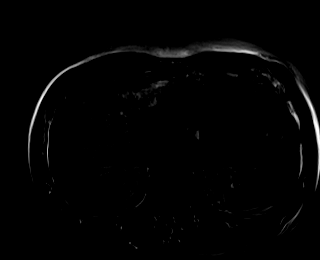

[Series 20: T1 dynamic · axial · 3.0mm · 0.84mm/px · z∈[-86,+199]mm · 2 of 96 slices shown (9 of 14)]
[im 1/96]
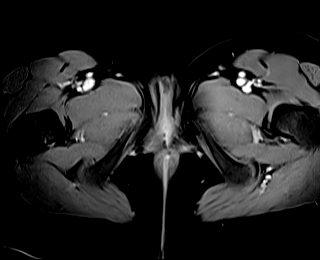
[im 96/96]
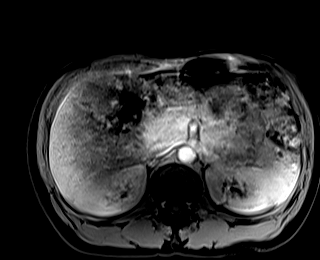

[Series 21: T1 dynamic · axial · 3.0mm · 0.84mm/px · z∈[-86,+199]mm · 2 of 96 slices shown (10 of 14)]
[im 1/96]
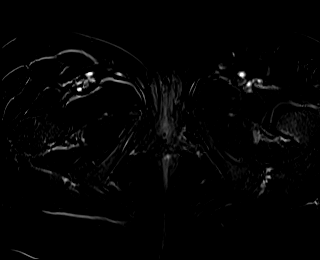
[im 96/96]
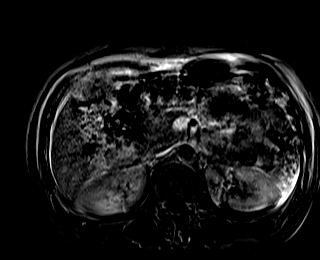

[Series 22: T1 dynamic · axial · 3.0mm · 0.84mm/px · z∈[-86,+199]mm · 2 of 96 slices shown (11 of 14)]
[im 1/96]
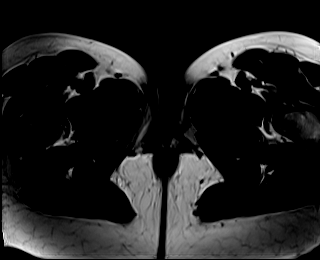
[im 96/96]
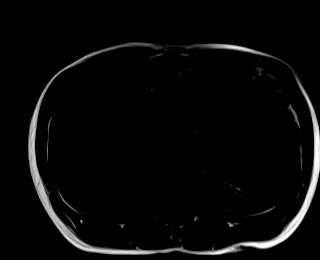

[Series 23: T1 dynamic · axial · 3.0mm · 0.84mm/px · z∈[-86,+199]mm · 2 of 92 slices shown (12 of 14)]
[im 1/92]
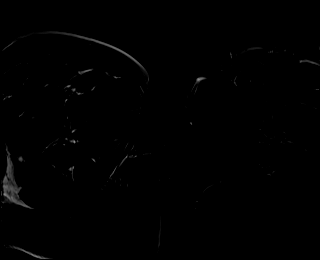
[im 92/92]
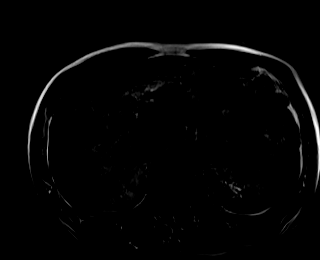

[Series 24: T1 dynamic · axial · 3.0mm · 0.84mm/px · z∈[-86,+199]mm · 2 of 96 slices shown (13 of 14)]
[im 1/96]
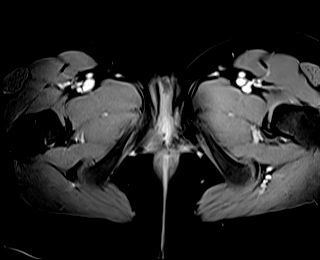
[im 96/96]
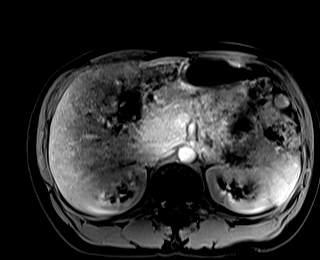

[Series 25: T1 dynamic · axial · 3.0mm · 0.84mm/px · z∈[-86,+199]mm · 2 of 96 slices shown (14 of 14)]
[im 1/96]
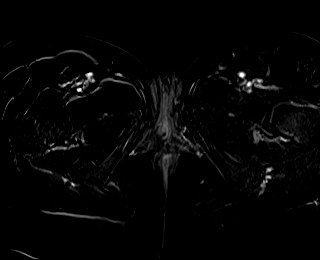
[im 96/96]
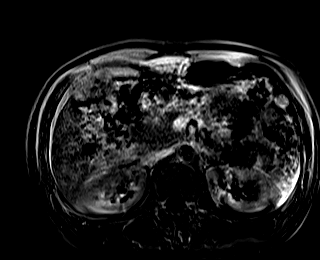

[Series 26: T1 dynamic post-contrast · axial · 3.0mm · 1.06mm/px · z∈[-83,+202]mm · 2 of 96 slices shown (1 of 4)]
[im 1/96]
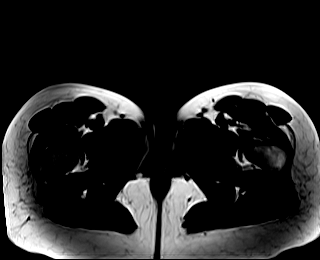
[im 96/96]
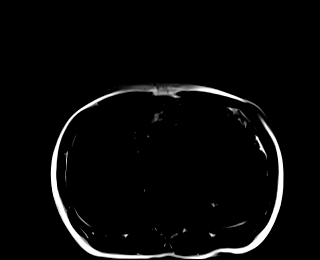

[Series 27: T1 dynamic post-contrast · axial · 3.0mm · 1.06mm/px · z∈[-83,+202]mm · 2 of 96 slices shown (2 of 4)]
[im 1/96]
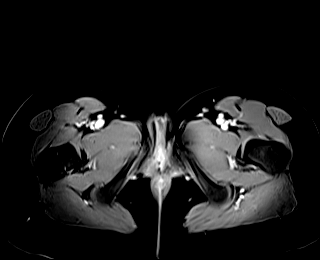
[im 96/96]
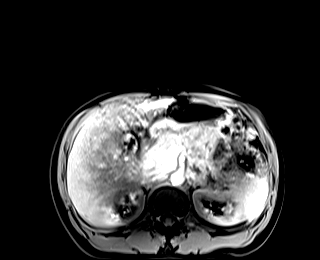

[Series 28: T1 dynamic post-contrast · sagittal · 3.0mm · 0.94mm/px · 2 of 80 slices shown (3 of 4)]
[im 1/80]
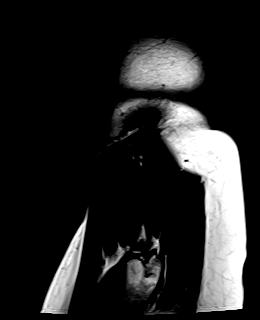
[im 80/80]
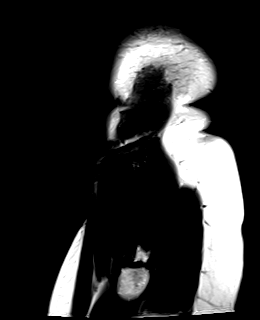

[Series 29: T1 dynamic post-contrast · sagittal · 3.0mm · 0.94mm/px · 2 of 80 slices shown (4 of 4)]
[im 1/80]
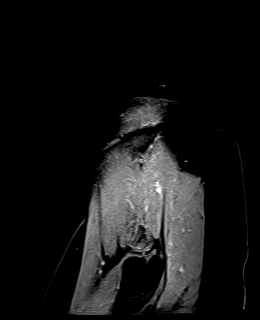
[im 80/80]
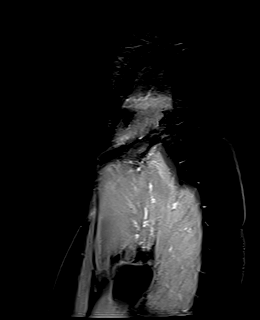

[46 of 48 positions shown; findings below may reference images not displayed]

FINDINGS: Lower Urinary Tract: No bladder or urethral abnormality identified.

Bowel:  Unremarkable visualized pelvic bowel loops.

Vascular/Lymphatic: No pathologically enlarged lymph nodes or other
significant abnormality.

Reproductive:

-- Uterus: Measures 22.6 x 10.2 x 17.6 cm (volume = [V0] cm^3).
This shows mild decrease in size compared to [V0] cm3 on prior exam.
Innumerable fibroids are seen throughout the uterus. These fibroids
are predominantly intramural in location, and some fibroids have
submucosal and subserosal components. These fibroids range in size
from less than 1 cm to the largest which measures 8.4 cm in maximum
diameter. No evidence of abnormal endometrial thickening or
hydrometros. Cervix is unremarkable in appearance.

-- Intracavitary fibroids:  None.

-- Pedunculated fibroids: None.

-- Fibroid contrast enhancement: Several fibroids in the uterine
fundus now show diffuse T1 hyperintensity and lack of contrast
enhancement, consistent with diffuse hemorrhagic degeneration. This
is new since previous study, and these fibroids show mild decrease
in size compared to prior exam. Other fibroids show varying degrees
of heterogeneous contrast enhancement and no significant change in
size.

-- Right ovary:  Appears normal.  No mass identified.

-- Left ovary:  Appears normal.  No mass identified.

Other: No abnormal free fluid.

Musculoskeletal:  Unremarkable.
IMPRESSION: Markedly enlarged uterus with diffuse uterine involvement by
fibroids. The uterus shows mild decrease in size since previous
study due to hemorrhagic degeneration of several fibroids in the
uterine fundus.

No intracavitary or pedunculated fibroids identified.

Normal appearance of both ovaries. No adnexal mass identified.

## 2021-03-19 MED ORDER — GADOBUTROL 1 MMOL/ML IV SOLN
6.0000 mL | Freq: Once | INTRAVENOUS | Status: AC | PRN
  Administered 2021-03-19: 6 mL via INTRAVENOUS

## 2021-03-25 ENCOUNTER — Other Ambulatory Visit: Payer: Self-pay

## 2021-03-25 ENCOUNTER — Inpatient Hospital Stay: Payer: Managed Care, Other (non HMO) | Attending: Gynecologic Oncology | Admitting: Gynecologic Oncology

## 2021-03-25 ENCOUNTER — Telehealth: Payer: Self-pay | Admitting: *Deleted

## 2021-03-25 VITALS — BP 126/95 | HR 120 | Temp 97.5°F | Resp 18 | Ht 63.0 in | Wt 114.7 lb

## 2021-03-25 DIAGNOSIS — D649 Anemia, unspecified: Secondary | ICD-10-CM | POA: Insufficient documentation

## 2021-03-25 DIAGNOSIS — D251 Intramural leiomyoma of uterus: Secondary | ICD-10-CM | POA: Diagnosis present

## 2021-03-25 DIAGNOSIS — D250 Submucous leiomyoma of uterus: Secondary | ICD-10-CM | POA: Diagnosis not present

## 2021-03-25 DIAGNOSIS — A6000 Herpesviral infection of urogenital system, unspecified: Secondary | ICD-10-CM | POA: Insufficient documentation

## 2021-03-25 MED ORDER — IBUPROFEN 800 MG PO TABS: 800.0000 mg | ORAL_TABLET | Freq: Three times a day (TID) | ORAL | 0 refills | Status: AC | PRN

## 2021-03-25 MED ORDER — TRAMADOL HCL 50 MG PO TABS: 50.0000 mg | ORAL_TABLET | Freq: Four times a day (QID) | ORAL | 0 refills | Status: AC | PRN

## 2021-03-25 MED ORDER — VALACYCLOVIR HCL 1 G PO TABS: 1000.0000 mg | ORAL_TABLET | Freq: Every day | ORAL | 3 refills | Status: AC

## 2021-03-25 MED ORDER — SENNOSIDES-DOCUSATE SODIUM 8.6-50 MG PO TABS: 2.0000 | ORAL_TABLET | Freq: Every day | ORAL | 0 refills | Status: AC

## 2021-03-25 NOTE — Telephone Encounter (Signed)
Per [name] rechecked the patient pulse. Pulse manual with a count of 84, then checked with the pulse ox machine with a count of 93.    Katrina Grave RN listened to the patient's heart rhythm, heart rhythm was normal

## 2021-03-25 NOTE — Patient Instructions (Signed)
Preparing for your Surgery  Plan for surgery on April 27, 2021 with Dr. Everitt Amber at [place] will be scheduled for a robotic assisted total laparoscopic hysterectomy (removal of the uterus and cervix), bilateral salpingectomy (removal of both fallopian tubes), mini laparotomy (larger incision on your abdomen).   Pre-operative Testing -You will receive a phone call from presurgical testing at [facility] to arrange for a pre-operative appointment and lab work.  -Bring your insurance card, copy of an advanced directive if applicable, medication list  -At that visit, you will be asked to sign a consent for a possible blood transfusion in case a transfusion becomes necessary during surgery.  The need for a blood transfusion is rare but having consent is a necessary part of your care.     -You should not be taking blood thinners or aspirin at least ten days prior to surgery unless instructed by your surgeon.  -Do not take supplements such as fish oil (omega 3), red yeast rice, turmeric before your surgery. You want to avoid medications with aspirin in them including headache powders such as BC or Goody's), Excedrin migraine.  Day Before Surgery at [facility] will be asked to take in a light diet the day before surgery. You will be advised you can have clear liquids up until 3 hours before your surgery.    Eat a light diet the day before surgery.  Examples including soups, broths, toast, yogurt, mashed potatoes.  AVOID GAS PRODUCING FOODS. Things to avoid include carbonated beverages (fizzy beverages, sodas), raw fruits and raw vegetables (uncooked), or beans.   If your bowels are filled with gas, your surgeon will have difficulty visualizing your pelvic organs which increases your surgical risks.  Your role in recovery Your role is to become active as soon as directed by your doctor, while still giving yourself time to heal.  Rest when you feel tired. You will be asked to  do the following in order to speed your recovery:  - Cough and breathe deeply. This helps to clear and expand your lungs and can prevent pneumonia after surgery.  - Dejanee Thibeaux. Do mild physical activity. Walking or moving your legs help your circulation and body functions return to normal. Do not try to get up or walk alone the first time after surgery.   -If you develop swelling on one leg or the other, pain in the back of your leg, redness/warmth in one of your legs, please call the office or go to the Emergency Room to have a doppler to rule out a blood clot. For shortness of breath, chest pain-seek care in the Emergency Room as soon as possible. - Actively manage your pain. Managing your pain lets you move in comfort. We will ask you to rate your pain on a scale of zero to 10. It is your responsibility to tell your doctor or nurse where and how much you hurt so your pain can be treated.  Special Considerations -If you are diabetic, you may be placed on insulin after surgery to have closer control over your blood sugars to promote healing and recovery.  This does not mean that you will be discharged on insulin.  If applicable, your oral antidiabetics will be resumed when you are tolerating a solid diet.  -Your final pathology results from surgery should be available around one week after surgery and the results will be relayed to you when available.  -Dr. Lahoma Crocker is  the surgeon that assists your GYN Oncologist with surgery.  If you end up staying the night, the next day after your surgery you will either see Dr. Denman George, Dr. Berline Lopes, or Dr. Lahoma Crocker.  -FMLA forms can be faxed to [phone] and please allow 5-7 business days for completion.  Pain Management After Surgery -You have been prescribed your pain medication and bowel regimen medications before surgery so that you can have these available when you are discharged from the hospital. The pain medication  is for use ONLY AFTER surgery and a new prescription will not be given.   -Make sure that you have Tylenol and Ibuprofen at home to use on a regular basis after surgery for pain control. We recommend alternating the medications every hour to six hours since they work differently and are processed in the body differently for pain relief.  -Review the attached handout on narcotic use and their risks and side effects.   Bowel Regimen -You have been prescribed Sennakot-S to take nightly to prevent constipation especially if you are taking the narcotic pain medication intermittently.  It is important to prevent constipation and drink adequate amounts of liquids. You can stop taking this medication when you are not taking pain medication and you are back on your normal bowel routine.  Risks of Surgery Risks of surgery are low but include bleeding, infection, damage to surrounding structures, re-operation, blood clots, and very rarely death.   Blood Transfusion Information (For the consent to be signed before surgery)  We will be checking your blood type before surgery so in case of emergencies, we will know what type of blood you would need.                                            WHAT IS A BLOOD TRANSFUSION?  A transfusion is the replacement of blood or some of its parts. Blood is made up of multiple cells which provide different functions. Red blood cells carry oxygen and are used for blood loss replacement. White blood cells fight against infection. Platelets control bleeding. Plasma helps clot blood. Other blood products are available for specialized needs, such as hemophilia or other clotting disorders. BEFORE THE TRANSFUSION  Who gives blood for transfusions?  You may be able to donate blood to be used at a later date on yourself (autologous donation). Relatives can be asked to donate blood. This is generally not any safer than if you have received blood from a stranger. The same  precautions are taken to ensure safety when a relative's blood is donated. Healthy volunteers who are fully evaluated to make sure their blood is safe. This is blood bank blood. Transfusion therapy is the safest it has ever been in the practice of medicine. Before blood is taken from a donor, a complete history is taken to make sure that person has no history of diseases nor engages in risky social behavior (examples are intravenous drug use or sexual activity with multiple partners). The donor's travel history is screened to minimize risk of transmitting infections, such as malaria. The donated blood is tested for signs of infectious diseases, such as HIV and hepatitis. The blood is then tested to be sure it is compatible with you in order to minimize the chance of a transfusion reaction. If you or a relative donates blood, this is often done in anticipation of surgery  and is not appropriate for emergency situations. It takes many days to process the donated blood. RISKS AND COMPLICATIONS Although transfusion therapy is very safe and saves many lives, the main dangers of transfusion include:  Getting an infectious disease. Developing a transfusion reaction. This is an allergic reaction to something in the blood you were given. Every precaution is taken to prevent this. The decision to have a blood transfusion has been considered carefully by your caregiver before blood is given. Blood is not given unless the benefits outweigh the risks.  AFTER SURGERY INSTRUCTIONS  Return to work: 4 weeks if applicable  Activity: 1. Be up and out of the bed during the day.  Take a nap if needed.  You may walk up steps but be careful and use the hand rail.  Stair climbing will tire you more than you think, you may need to stop part way and rest.   2. No lifting or straining for 6 weeks over 10 pounds. No pushing, pulling, straining for 6 weeks.  3. No driving for around 1 week(s).  Do not drive if you are taking  narcotic pain medicine and make sure that your reaction time has returned.   4. You can shower as soon as the next day after surgery. Shower daily.  Use your regular soap and water (not directly on the incision) and pat your incision(s) dry afterwards; don't rub.  No tub baths or submerging your body in water until cleared by your surgeon. If you have the soap that was given to you by pre-surgical testing that was used before surgery, you do not need to use it afterwards because this can irritate your incisions.   5. No sexual activity and nothing in the vagina for 8 weeks.  6. You may experience a small amount of clear drainage from your incisions, which is normal.  If the drainage persists, increases, or changes color please call the office.  7. Do not use creams, lotions, or ointments such as neosporin on your incisions after surgery until advised by your surgeon because they can cause removal of the dermabond glue on your incisions.    8. You may experience vaginal spotting after surgery or around the 6-8 week mark from surgery when the stitches at the top of the vagina begin to dissolve.  The spotting is normal but if you experience heavy bleeding, call our office.  9. Take Tylenol or ibuprofen first for pain and only use narcotic pain medication for severe pain not relieved by the Tylenol or Ibuprofen.  Monitor your Tylenol intake to a max of 4,000 mg in a 24 hour period. You can alternate these medications after surgery.  Diet: 1. Low sodium Heart Healthy Diet is recommended but you are cleared to resume your normal (before surgery) diet after your procedure.  2. It is safe to use a laxative, such as Miralax or Colace, if you have difficulty moving your bowels. You have been prescribed Sennakot at bedtime every evening to keep bowel movements regular and to prevent constipation.    Wound Care: 1. Keep clean and dry.  Shower daily.  Reasons to call the Doctor: Fever - Oral temperature  greater than 100.4 degrees Fahrenheit Foul-smelling vaginal discharge Difficulty urinating Nausea and vomiting Increased pain at the site of the incision that is unrelieved with pain medicine. Difficulty breathing with or without chest pain New calf pain especially if only on one side Sudden, continuing increased vaginal bleeding with or without clots.  Contacts: For questions or concerns you should contact:  Dr. Everitt Amber at [phone]  Joylene John, NP at [phone]  After Hours: call [phone] and have the GYN Oncologist paged/contacted (after 5 pm or on the weekends).  Messages sent via mychart are for non-urgent matters and are not responded to after hours so for urgent needs, please call the after hours number.

## 2021-03-25 NOTE — Progress Notes (Signed)
Follow-up Note: Gyn-Onc  Consult was initially requested by Dr. Matthew Saras for the evaluation of Katrina Barron 35 y.o. female  CC:  Chief Complaint  Patient presents with   Intramural and submucous leiomyoma of uterus    Assessment/Plan:  Ms. Mattison Golay  is a 35 y.o.  year old with a bulky, symptomatic fibroid uterus measuring 22.6cm, s/p lupron with response (decrease in size)   Desires definitive treatment with hysterectomy. She understands that this will result in permanent infertility.  Based on my physical examination today, she is a high likelihood of being a candidate for a minimally invasive approach with mini laparotomy for specimen delivery and morcellation.    She was counseled regarding the risks of the procedure and the potential for conversion to laparotomy. If the procedure goes well she will be discharged same day. She will be off work for 4 weeks.   HPI: Ms Katrina Barron is a 35 year old P1 who was seen in consultation at the request of Dr Matthew Saras for evaluation of a bulky fibroid uterus. 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  The patient has had a bulky fibroid uterus for many years.  This causes mass-effect symptoms of abdominal discomfort and bloating, urinary frequency, and constipation.  She also has menorrhagia, though has not required blood transfusions but does have a documented history of anemia.  The patient had been seen by Dr. Matthew Saras in 2021 and an ultrasound of the uterus was performed on 05/07/2020.  This revealed a uterus measuring 32.2 x 9.9 x 12.9 cm the left ovary was normal and the right ovary not visualized.  This was followed up with an MRI on May 22, 2020 which revealed a uterus measuring 26.3 x 10.5 x 18.3 cm with new numerous intramural, submucosal, and subserosal fibroids.  The ovaries were grossly normal.  She was scheduled for surgery with Dr. Dominga Ferry for an anticipated exploratory laparotomy with total abdominal  hysterectomy for October 2021.  This surgery was canceled by the patient when she was unable to pay the payment plan preoperatively and desired waiting until the new year due to the holiday season.  Endometrial biopsy on 12/17/20 showed benign endometrium with breakdown and no maignancy. Hb was 11.3 on CBC drawn on 3.3.22.  She was planned to undergo robotic assisted total hysterectomy with bilateral salpingectomy and minilaparotomy. She was prescribed lupron for 3 months to illicit fibroid resolution/shrinking. After this she began developing symptoms of vaginal drainage.  Interval Hx: Repeat MRI of the pelvis performed on 03/19/2021 which was 3 months after administration of Lupron revealed a markedly enlarged uterus with diffuse your involvement by fibroids.  There was a mild decrease in size since the previous study due to hemorrhagic degeneration of several fibroids in the fundus.  Uterine dimensions now measured 22.6 x 10.2 x 17.6 cm. She is ready for her hysterectomy.   Current Meds:  Outpatient Encounter Medications as of 03/25/2021  Medication Sig   Ferrous Sulfate (IRON PO) Take 1 tablet by mouth as needed. (Patient not taking: Reported on 12/16/2020)   Multiple Vitamins-Minerals (MULTIVITAMIN WOMEN PO) Take 1 tablet by mouth daily.   valACYclovir (VALTREX) 1000 MG tablet Take 1 tablet (1,000 mg total) by mouth daily. For prevention or 1/2 bid during outbreak X 3days (Patient not taking: Reported on 12/16/2020)   No facility-administered encounter medications on file as of 03/25/2021.    Allergy: No Known Allergies  Social Hx:   Social History  Socioeconomic History   Marital status: Single    Spouse name: Not on file   Number of children: Not on file   Years of education: Not on file   Highest education level: Not on file  Occupational History   Occupation: HouseKeeper    Employer: [employer]  Tobacco Use   Smoking status: Never   Smokeless tobacco: Never  Vaping Use    Vaping Use: Never used  Substance and Sexual Activity   Alcohol use: No   Drug use: No   Sexual activity: Not Currently    Birth control/protection: None  Other Topics Concern   Not on file  Social History Narrative   Works as a Secretary/administrator, son was born in 2008 and has SST. She is in a monogamous relationship with her fiance.   Social Determinants of Health   Financial Resource Strain: Not on file  Food Insecurity: Not on file  Transportation Needs: Not on file  Physical Activity: Not on file  Stress: Not on file  Social Connections: Not on file  Intimate Partner Violence: Not on file    Past Surgical Hx: No past surgical history on file.  Past Medical Hx:  Past Medical History:  Diagnosis Date   Anemia    Genital herpes simplex     Past Gynecological History:  See HPI, SVD x 1, fibroid uterus No LMP recorded.  Family Hx:  Family History  Problem Relation Age of Onset   Sickle cell trait Son    Cancer Maternal Grandmother    Diabetes Maternal Grandfather    Colon cancer Neg Hx    Breast cancer Neg Hx    Ovarian cancer Neg Hx    Endometrial cancer Neg Hx    Prostate cancer Neg Hx    Pancreatic cancer Neg Hx     Review of Systems:  Constitutional  Feels well,    ENT Normal appearing ears and nares bilaterally Skin/Breast  No rash, sores, jaundice, itching, dryness Cardiovascular  No chest pain, shortness of breath, or edema  Pulmonary  No cough or wheeze.  Gastro Intestinal  No nausea, vomitting, or diarrhoea. No bright red blood per rectum, no abdominal pain, change in bowel movement, +constipation.  Genito Urinary  No frequency, urgency, dysuria, + urinary frequency, + menorrhagia Musculo Skeletal  No myalgia, arthralgia, joint swelling or pain  Neurologic  No weakness, numbness, change in gait,  Psychology  No depression, anxiety, insomnia.   Vitals:  Blood pressure (!) 126/95, pulse (!) 120, temperature (!) 97.5 F (36.4 C), temperature source  Tympanic, resp. rate 18, height 5\' 3"  (1.6 m), weight 114 lb 11.2 oz (52 kg), SpO2 100 %.  Physical Exam: WD in NAD Neck  Supple NROM, without any enlargements.  Lymph Node Survey No cervical supraclavicular or inguinal adenopathy Cardiovascular  Well perfused peripheries Lungs  No increased WOB Skin  No rash/lesions/breakdown  Psychiatry  Alert and oriented to person, place, and time  Abdomen  Normoactive bowel sounds, abdomen soft, non-tender and thin without evidence of hernia. Large uterus filling the abdomen, minimally mobile, spans width of abdomen. Back No CVA tenderness Genito Urinary  Vulva/vagina: Normal external female genitalia.  No lesions. No discharge or bleeding.  Bladder/urethra:  No lesions or masses, well supported bladder  Vagina: normal  Cervix: Normal appearing, no lesions.  Uterus:Bulky, mobile cervix and lower uterine segment, uterus sits relatively high in pelvis, uterus fills entire abdomen to close to xiphoid.   Adnexa: no discretely palpable masses. Rectal  Deferred  Extremities  No bilateral cyanosis, clubbing or edema.   Thereasa Solo, MD  03/25/2021, 3:46 PM

## 2021-03-30 ENCOUNTER — Other Ambulatory Visit: Payer: Self-pay | Admitting: Gynecologic Oncology

## 2021-03-30 DIAGNOSIS — D251 Intramural leiomyoma of uterus: Secondary | ICD-10-CM

## 2021-03-30 DIAGNOSIS — D250 Submucous leiomyoma of uterus: Secondary | ICD-10-CM

## 2021-04-16 NOTE — Progress Notes (Signed)
DUE TO COVID-19 ONLY ONE VISITOR IS ALLOWED TO COME WITH YOU AND STAY IN THE WAITING ROOM ONLY DURING PRE OP AND PROCEDURE DAY OF SURGERY. THE 1 VISITOR  MAY VISIT WITH YOU AFTER SURGERY IN YOUR PRIVATE ROOM DURING VISITING HOURS ONLY!  YOU NEED TO HAVE A COVID 19 TEST ON_______ @_______ , THIS TEST MUST BE DONE BEFORE SURGERY,  COVID TESTING SITE [address] [place] [address], IT IS ON THE RIGHT GOING OUT [address] APPROXIMATELY  2 MINUTES PAST [employer] ON THE RIGHT. ONCE YOUR COVID TEST IS COMPLETED,  PLEASE BEGIN THE QUARANTINE INSTRUCTIONS AS OUTLINED IN YOUR HANDOUT.                Katrina Barron  04/16/2021   Your procedure is scheduled on:         04/27/2021   Report to [facility] Main  Entrance   Report to admitting at     [phone]     Call this number if you have problems the morning of surgery [phone]    Remember: Do not eat food , candy gum or mints :After Midnight. You may have clear liquids from midnight until  0430am    CLEAR LIQUID DIET   Foods Allowed                                                                       Coffee and tea, regular and decaf                              Plain Jell-O any favor except red or purple                                            Fruit ices (not with fruit pulp)                                      Iced Popsicles                                     Carbonated beverages, regular and diet                                    Cranberry, grape and apple juices Sports drinks like Gatorade Lightly seasoned clear broth or consume(fat free) Sugar, honey syrup   _____________________________________________________________________    BRUSH YOUR TEETH MORNING OF SURGERY AND RINSE YOUR MOUTH OUT, NO CHEWING GUM CANDY OR MINTS.     Take these medicines the morning of surgery with A SIP OF WATER: none   DO NOT TAKE ANY DIABETIC MEDICATIONS DAY OF YOUR SURGERY                               You  may not  have any metal on your body including hair pins and              piercings  Do not wear jewelry, make-up, lotions, powders or perfumes, deodorant             Do not wear nail polish on your fingernails.  Do not shave  48 hours prior to surgery.              Men may shave face and neck.   Do not bring valuables to the hospital. [place].  Contacts, dentures or bridgework may not be worn into surgery.  Leave suitcase in the car. After surgery it may be brought to your room.     Patients discharged the day of surgery will not be allowed to drive home. IF YOU ARE HAVING SURGERY AND GOING HOME THE SAME DAY, YOU MUST HAVE AN ADULT TO DRIVE YOU HOME AND BE WITH YOU FOR 24 HOURS. YOU MAY GO HOME BY TAXI OR UBER OR ORTHERWISE, BUT AN ADULT MUST ACCOMPANY YOU HOME AND STAY WITH YOU FOR 24 HOURS.  Name and phone number of your driver:  Special Instructions: N/A              Please read over the following fact sheets you were given: _____________________________________________________________________  [facility] - Preparing for Surgery Before surgery, you can play an important role.  Because skin is not sterile, your skin needs to be as free of germs as possible.  You can reduce the number of germs on your skin by washing with CHG (chlorahexidine gluconate) soap before surgery.  CHG is an antiseptic cleaner which kills germs and bonds with the skin to continue killing germs even after washing. Please DO NOT use if you have an allergy to CHG or antibacterial soaps.  If your skin becomes reddened/irritated stop using the CHG and inform your nurse when you arrive at Short Stay. Do not shave (including legs and underarms) for at least 48 hours prior to the first CHG shower.  You may shave your face/neck. Please follow these instructions carefully:  1.  Shower with CHG Soap the night before surgery and the  morning of Surgery.  2.  If you choose to  wash your hair, wash your hair first as usual with your  normal  shampoo.  3.  After you shampoo, rinse your hair and body thoroughly to remove the  shampoo.                           4.  Use CHG as you would any other liquid soap.  You can apply chg directly  to the skin and wash                       Gently with a scrungie or clean washcloth.  5.  Apply the CHG Soap to your body ONLY FROM THE NECK DOWN.   Do not use on face/ open                           Wound or open sores. Avoid contact with eyes, ears mouth and genitals (private parts).  Wash face,  Genitals (private parts) with your normal soap.             6.  Wash thoroughly, paying special attention to the area where your surgery  will be performed.  7.  Thoroughly rinse your body with warm water from the neck down.  8.  DO NOT shower/wash with your normal soap after using and rinsing off  the CHG Soap.                9.  Pat yourself dry with a clean towel.            10.  Wear clean pajamas.            11.  Place clean sheets on your bed the night of your first shower and do not  sleep with pets. Day of Surgery : Do not apply any lotions/deodorants the morning of surgery.  Please wear clean clothes to the hospital/surgery center.  FAILURE TO FOLLOW THESE INSTRUCTIONS MAY RESULT IN THE CANCELLATION OF YOUR SURGERY PATIENT SIGNATURE_________________________________  NURSE SIGNATURE__________________________________  ________________________________________________________________________

## 2021-04-21 ENCOUNTER — Encounter (HOSPITAL_COMMUNITY)
Admission: RE | Admit: 2021-04-21 | Discharge: 2021-04-21 | Disposition: A | Discharge status: Home / Self Care | Payer: Managed Care, Other (non HMO) | Source: Ambulatory Visit | Attending: Gynecologic Oncology | Admitting: Gynecologic Oncology

## 2021-04-21 ENCOUNTER — Encounter: Payer: Self-pay | Admitting: Gynecologic Oncology

## 2021-04-21 ENCOUNTER — Encounter (HOSPITAL_COMMUNITY): Payer: Self-pay

## 2021-04-21 ENCOUNTER — Other Ambulatory Visit: Payer: Self-pay

## 2021-04-21 DIAGNOSIS — Z01818 Encounter for other preprocedural examination: Secondary | ICD-10-CM | POA: Insufficient documentation

## 2021-04-21 HISTORY — DX: Headache, unspecified: R51.9

## 2021-04-21 LAB — COMPREHENSIVE METABOLIC PANEL
ALT: 24 U/L (ref 0–44)
AST: 41 U/L (ref 15–41)
Albumin: 4 g/dL (ref 3.5–5.0)
Alkaline Phosphatase: 69 U/L (ref 38–126)
Anion gap: 6 (ref 5–15)
BUN: 9 mg/dL (ref 6–20)
CO2: 31 mmol/L (ref 22–32)
Calcium: 10 mg/dL (ref 8.9–10.3)
Chloride: 101 mmol/L (ref 98–111)
Creatinine, Ser: 0.57 mg/dL (ref 0.44–1.00)
GFR, Estimated: >60 mL/min (ref >60)
Glucose, Bld: 90 mg/dL (ref 70–99)
Potassium: 4.1 mmol/L (ref 3.5–5.1)
Sodium: 138 mmol/L (ref 135–145)
Total Bilirubin: 0.9 mg/dL (ref 0.3–1.2)
Total Protein: 8.1 g/dL (ref 6.5–8.1)

## 2021-04-21 LAB — CBC
HCT: 43.3 % (ref 36.0–46.0)
Hemoglobin: 13.7 g/dL (ref 12.0–15.0)
MCH: 26.5 pg (ref 26.0–34.0)
MCHC: 31.6 g/dL (ref 30.0–36.0)
MCV: 83.8 fL (ref 80.0–100.0)
Platelets: 463 10*3/uL — ABNORMAL HIGH (ref 150–400)
RBC: 5.17 MIL/uL — ABNORMAL HIGH (ref 3.87–5.11)
RDW: 15.8 % — ABNORMAL HIGH (ref 11.5–15.5)
WBC: 3.4 10*3/uL — ABNORMAL LOW (ref 4.0–10.5)
nRBC: 0 % (ref 0.0–0.2)

## 2021-04-21 NOTE — Progress Notes (Signed)
Anesthesia Review:  PCP: Cardiologist : Chest x-ray : EKG : Echo : Stress test: Cardiac Cath :  Activity level:  Sleep Study/ CPAP : Fasting Blood Sugar :      / Checks Blood Sugar -- times a day:   Blood Thinner/ Instructions /Last Dose: ASA / Instructions/ Last Dose :   Heart rate at time of preop was 117-125.  PT voinces no complaints except a hot flash at time of preop which pt reports she has had since receiving  Lupron injection in 01/2021.   PT also reports having double shot of [employer] this am prior to preop appt .  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   EKg was done showing Sr rate of 98.  Katrina Cass, NP made aware of above and EKG was noted by her.  No new orders given

## 2021-04-23 ENCOUNTER — Telehealth: Payer: Self-pay

## 2021-04-23 NOTE — Telephone Encounter (Signed)
Reviewed written pre-op instructions from 04-21-21 with Katrina Barron. Pt verbalizes understanding and no questions or concerns noted at this time.

## 2021-04-26 NOTE — Anesthesia Preprocedure Evaluation (Addendum)
Anesthesia Evaluation  Patient identified by MRN, date of birth, ID band Patient awake    Reviewed: Allergy & Precautions, NPO status , Patient's Chart, lab work & pertinent test results  History of Anesthesia Complications Negative for: history of anesthetic complications  Airway Mallampati: I  TM Distance: >3 FB Neck ROM: Full    Dental  (+) Dental Advisory Given, Teeth Intact   Pulmonary neg pulmonary ROS,    Pulmonary exam normal        Cardiovascular negative cardio ROS Normal cardiovascular exam     Neuro/Psych  Headaches, negative psych ROS   GI/Hepatic negative GI ROS, Neg liver ROS,   Endo/Other  negative endocrine ROS  Renal/GU negative Renal ROS     Musculoskeletal negative musculoskeletal ROS (+)   Abdominal   Peds  Hematology negative hematology ROS (+)   Anesthesia Other Findings HSV   Reproductive/Obstetrics  Leimyoma of uterus                             Anesthesia Physical Anesthesia Plan  ASA: 2  Anesthesia Plan: General   Post-op Pain Management:    Induction: Intravenous  PONV Risk Score and Plan: 4 or greater and Treatment may vary due to age or medical condition, Ondansetron, Dexamethasone, Midazolam and Scopolamine patch - Pre-op  Airway Management Planned: Oral ETT  Additional Equipment: None  Intra-op Plan:   Post-operative Plan: Extubation in OR  Informed Consent: I have reviewed the patients History and Physical, chart, labs and discussed the procedure including the risks, benefits and alternatives for the proposed anesthesia with the patient or authorized representative who has indicated his/her understanding and acceptance.     Dental advisory given  Plan Discussed with: CRNA and Anesthesiologist  Anesthesia Plan Comments:        Anesthesia Quick Evaluation

## 2021-04-27 ENCOUNTER — Ambulatory Visit (HOSPITAL_COMMUNITY)
Admission: RE | Admit: 2021-04-27 | Discharge: 2021-04-27 | Disposition: A | Discharge status: Home / Self Care | Payer: Managed Care, Other (non HMO) | Source: Ambulatory Visit | Attending: Gynecologic Oncology | Admitting: Gynecologic Oncology

## 2021-04-27 ENCOUNTER — Encounter (HOSPITAL_COMMUNITY)
Admission: RE | Disposition: A | Discharge status: Home / Self Care | Payer: Self-pay | Source: Ambulatory Visit | Attending: Gynecologic Oncology

## 2021-04-27 ENCOUNTER — Ambulatory Visit (HOSPITAL_COMMUNITY): Payer: Managed Care, Other (non HMO) | Admitting: Emergency Medicine

## 2021-04-27 ENCOUNTER — Encounter (HOSPITAL_COMMUNITY): Payer: Self-pay | Admitting: Gynecologic Oncology

## 2021-04-27 ENCOUNTER — Ambulatory Visit (HOSPITAL_COMMUNITY): Payer: Managed Care, Other (non HMO) | Admitting: Certified Registered"

## 2021-04-27 DIAGNOSIS — K5900 Constipation, unspecified: Secondary | ICD-10-CM | POA: Diagnosis not present

## 2021-04-27 DIAGNOSIS — D252 Subserosal leiomyoma of uterus: Secondary | ICD-10-CM | POA: Diagnosis not present

## 2021-04-27 DIAGNOSIS — D250 Submucous leiomyoma of uterus: Secondary | ICD-10-CM

## 2021-04-27 DIAGNOSIS — N838 Other noninflammatory disorders of ovary, fallopian tube and broad ligament: Secondary | ICD-10-CM | POA: Diagnosis not present

## 2021-04-27 DIAGNOSIS — N920 Excessive and frequent menstruation with regular cycle: Secondary | ICD-10-CM | POA: Insufficient documentation

## 2021-04-27 DIAGNOSIS — D251 Intramural leiomyoma of uterus: Secondary | ICD-10-CM | POA: Diagnosis not present

## 2021-04-27 DIAGNOSIS — D259 Leiomyoma of uterus, unspecified: Secondary | ICD-10-CM | POA: Diagnosis present

## 2021-04-27 HISTORY — PX: XI ROBOTIC ASSISTED SALPINGECTOMY: SHX6824

## 2021-04-27 HISTORY — PX: ROBOTIC ASSISTED TOTAL HYSTERECTOMY: SHX6085

## 2021-04-27 LAB — PREGNANCY, URINE: Preg Test, Ur: NEGATIVE

## 2021-04-27 LAB — TYPE AND SCREEN
ABO/RH(D): O POS
Antibody Screen: NEGATIVE

## 2021-04-27 LAB — ABO/RH: ABO/RH(D): O POS

## 2021-04-27 SURGERY — HYSTERECTOMY, TOTAL, ROBOT-ASSISTED
Anesthesia: General

## 2021-04-27 MED ORDER — GABAPENTIN 300 MG PO CAPS
300.0000 mg | ORAL_CAPSULE | ORAL | Status: AC
  Administered 2021-04-27: 300 mg via ORAL
  Filled 2021-04-27: qty 1

## 2021-04-27 MED ORDER — ACETAMINOPHEN 500 MG PO TABS
1000.0000 mg | ORAL_TABLET | ORAL | Status: AC
  Administered 2021-04-27: 1000 mg via ORAL
  Filled 2021-04-27: qty 2

## 2021-04-27 MED ORDER — PHENYLEPHRINE HCL-NACL 10-0.9 MG/250ML-% IV SOLN
INTRAVENOUS | Status: DC | PRN
  Administered 2021-04-27: 30 ug/min via INTRAVENOUS

## 2021-04-27 MED ORDER — LIDOCAINE HCL 2 % IJ SOLN
INTRAMUSCULAR | Status: AC
  Filled 2021-04-27: qty 20

## 2021-04-27 MED ORDER — BUPIVACAINE LIPOSOME 1.3 % IJ SUSP
20.0000 mL | Freq: Once | INTRAMUSCULAR | Status: AC
  Administered 2021-04-27: 20 mL
  Filled 2021-04-27: qty 20

## 2021-04-27 MED ORDER — PHENYLEPHRINE HCL (PRESSORS) 10 MG/ML IV SOLN
INTRAVENOUS | Status: AC
  Filled 2021-04-27: qty 1

## 2021-04-27 MED ORDER — BUPIVACAINE HCL 0.25 % IJ SOLN
INTRAMUSCULAR | Status: AC
  Filled 2021-04-27: qty 1

## 2021-04-27 MED ORDER — PHENYLEPHRINE 40 MCG/ML (10ML) SYRINGE FOR IV PUSH (FOR BLOOD PRESSURE SUPPORT)
PREFILLED_SYRINGE | INTRAVENOUS | Status: DC | PRN
  Administered 2021-04-27 (×2): 80 ug via INTRAVENOUS

## 2021-04-27 MED ORDER — SCOPOLAMINE 1 MG/3DAYS TD PT72
1.0000 | MEDICATED_PATCH | TRANSDERMAL | Status: DC
Start: 2021-04-27 — End: 2021-04-27
  Administered 2021-04-27: 1.5 mg via TRANSDERMAL
  Filled 2021-04-27: qty 1

## 2021-04-27 MED ORDER — MIDAZOLAM HCL 5 MG/5ML IJ SOLN
INTRAMUSCULAR | Status: DC | PRN
  Administered 2021-04-27 (×2): 1 mg via INTRAVENOUS

## 2021-04-27 MED ORDER — FENTANYL CITRATE (PF) 100 MCG/2ML IJ SOLN
INTRAMUSCULAR | Status: DC | PRN
  Administered 2021-04-27 (×2): 25 ug via INTRAVENOUS
  Administered 2021-04-27: 50 ug via INTRAVENOUS

## 2021-04-27 MED ORDER — OXYCODONE HCL 5 MG/5ML PO SOLN: 5.0000 mg | Freq: Once | ORAL | Status: DC | PRN

## 2021-04-27 MED ORDER — LIDOCAINE 2% (20 MG/ML) 5 ML SYRINGE
INTRAMUSCULAR | Status: AC
  Filled 2021-04-27: qty 5

## 2021-04-27 MED ORDER — ROCURONIUM BROMIDE 10 MG/ML (PF) SYRINGE
PREFILLED_SYRINGE | INTRAVENOUS | Status: AC
  Filled 2021-04-27: qty 10

## 2021-04-27 MED ORDER — LACTATED RINGERS IR SOLN
Status: DC | PRN
  Administered 2021-04-27: 1

## 2021-04-27 MED ORDER — DEXAMETHASONE SODIUM PHOSPHATE 10 MG/ML IJ SOLN
INTRAMUSCULAR | Status: AC
  Filled 2021-04-27: qty 1

## 2021-04-27 MED ORDER — FENTANYL CITRATE (PF) 250 MCG/5ML IJ SOLN
INTRAMUSCULAR | Status: AC
  Filled 2021-04-27: qty 5

## 2021-04-27 MED ORDER — DEXAMETHASONE SODIUM PHOSPHATE 4 MG/ML IJ SOLN
4.0000 mg | INTRAMUSCULAR | Status: DC
Start: 2021-04-27 — End: 2021-04-27

## 2021-04-27 MED ORDER — PROPOFOL 10 MG/ML IV BOLUS
INTRAVENOUS | Status: DC | PRN
  Administered 2021-04-27: 120 mg via INTRAVENOUS

## 2021-04-27 MED ORDER — CHLORHEXIDINE GLUCONATE 0.12 % MT SOLN
15.0000 mL | Freq: Once | OROMUCOSAL | Status: AC
  Administered 2021-04-27: 15 mL via OROMUCOSAL

## 2021-04-27 MED ORDER — LIDOCAINE 2% (20 MG/ML) 5 ML SYRINGE
INTRAMUSCULAR | Status: DC | PRN
  Administered 2021-04-27: 40 mg via INTRAVENOUS

## 2021-04-27 MED ORDER — LIDOCAINE HCL (PF) 2 % IJ SOLN
INTRAMUSCULAR | Status: DC | PRN
  Administered 2021-04-27: 1.5 mg/kg/h via INTRADERMAL

## 2021-04-27 MED ORDER — OXYCODONE HCL 5 MG PO TABS: 5.0000 mg | ORAL_TABLET | Freq: Once | ORAL | Status: DC | PRN

## 2021-04-27 MED ORDER — ONDANSETRON HCL 4 MG/2ML IJ SOLN
INTRAMUSCULAR | Status: AC
  Filled 2021-04-27: qty 2

## 2021-04-27 MED ORDER — MIDAZOLAM HCL 2 MG/2ML IJ SOLN
INTRAMUSCULAR | Status: AC
  Filled 2021-04-27: qty 2

## 2021-04-27 MED ORDER — LACTATED RINGERS IV SOLN: INTRAVENOUS | Status: DC

## 2021-04-27 MED ORDER — PROPOFOL 10 MG/ML IV BOLUS
INTRAVENOUS | Status: AC
  Filled 2021-04-27: qty 20

## 2021-04-27 MED ORDER — KETAMINE HCL 10 MG/ML IJ SOLN
INTRAMUSCULAR | Status: DC | PRN
  Administered 2021-04-27 (×2): 10 mg via INTRAVENOUS
  Administered 2021-04-27: 20 mg via INTRAVENOUS

## 2021-04-27 MED ORDER — ROCURONIUM BROMIDE 10 MG/ML (PF) SYRINGE
PREFILLED_SYRINGE | INTRAVENOUS | Status: DC | PRN
  Administered 2021-04-27: 30 mg via INTRAVENOUS
  Administered 2021-04-27 (×2): 50 mg via INTRAVENOUS

## 2021-04-27 MED ORDER — SODIUM CHLORIDE 0.9 % IV SOLN
2.0000 g | INTRAVENOUS | Status: AC
  Administered 2021-04-27: 2 g via INTRAVENOUS
  Filled 2021-04-27: qty 2

## 2021-04-27 MED ORDER — SUGAMMADEX SODIUM 200 MG/2ML IV SOLN
INTRAVENOUS | Status: DC | PRN
  Administered 2021-04-27: 200 mg via INTRAVENOUS

## 2021-04-27 MED ORDER — SODIUM CHLORIDE 0.9% FLUSH
3.0000 mL | Freq: Two times a day (BID) | INTRAVENOUS | Status: DC
Start: 2021-04-27 — End: 2021-04-27

## 2021-04-27 MED ORDER — PROMETHAZINE HCL 25 MG/ML IJ SOLN: 6.2500 mg | INTRAMUSCULAR | Status: DC | PRN

## 2021-04-27 MED ORDER — CELECOXIB 200 MG PO CAPS
400.0000 mg | ORAL_CAPSULE | ORAL | Status: AC
Start: 2021-04-27 — End: 2021-04-27
  Administered 2021-04-27: 400 mg via ORAL
  Filled 2021-04-27: qty 2

## 2021-04-27 MED ORDER — KETAMINE HCL 10 MG/ML IJ SOLN
INTRAMUSCULAR | Status: AC
  Filled 2021-04-27: qty 1

## 2021-04-27 MED ORDER — ONDANSETRON HCL 4 MG/2ML IJ SOLN
INTRAMUSCULAR | Status: DC | PRN
  Administered 2021-04-27: 4 mg via INTRAVENOUS

## 2021-04-27 MED ORDER — BUPIVACAINE HCL 0.25 % IJ SOLN
INTRAMUSCULAR | Status: DC | PRN
  Administered 2021-04-27: 30 mL
  Administered 2021-04-27: 20 mL

## 2021-04-27 MED ORDER — FENTANYL CITRATE (PF) 100 MCG/2ML IJ SOLN: 25.0000 ug | INTRAMUSCULAR | Status: DC | PRN

## 2021-04-27 MED ORDER — DEXAMETHASONE SODIUM PHOSPHATE 10 MG/ML IJ SOLN
INTRAMUSCULAR | Status: DC | PRN
  Administered 2021-04-27: 10 mg via INTRAVENOUS

## 2021-04-27 SURGICAL SUPPLY — 62 items
ADH SKN CLS APL DERMABOND .7 (GAUZE/BANDAGES/DRESSINGS) ×4
BACTOSHIELD CHG 4% 4OZ (MISCELLANEOUS) ×1
BAG COUNTER SPONGE SURGICOUNT (BAG) ×1 IMPLANT
BAG SPNG CNTER NS LX DISP (BAG) ×2
BLADE SURG SZ10 CARB STEEL (BLADE) ×1 IMPLANT
COVER BACK TABLE 60X90IN (DRAPES) ×3 IMPLANT
COVER TIP SHEARS 8 DVNC (MISCELLANEOUS) ×2 IMPLANT
COVER TIP SHEARS 8MM DA VINCI (MISCELLANEOUS) ×3
DERMABOND ADVANCED (GAUZE/BANDAGES/DRESSINGS) ×2
DERMABOND ADVANCED .7 DNX12 (GAUZE/BANDAGES/DRESSINGS) ×2 IMPLANT
DRAPE ARM DVNC X/XI (DISPOSABLE) ×8 IMPLANT
DRAPE COLUMN DVNC XI (DISPOSABLE) ×2 IMPLANT
DRAPE DA VINCI XI ARM (DISPOSABLE) ×12
DRAPE DA VINCI XI COLUMN (DISPOSABLE) ×3
DRAPE SHEET LG 3/4 BI-LAMINATE (DRAPES) ×3 IMPLANT
DRAPE SURG IRRIG POUCH 19X23 (DRAPES) ×3 IMPLANT
DRSG OPSITE POSTOP 4X6 (GAUZE/BANDAGES/DRESSINGS) ×1 IMPLANT
DRSG OPSITE POSTOP 4X8 (GAUZE/BANDAGES/DRESSINGS) ×1 IMPLANT
ELECT PENCIL ROCKER SW 15FT (MISCELLANEOUS) ×1 IMPLANT
ELECT REM PT RETURN 15FT ADLT (MISCELLANEOUS) ×3 IMPLANT
GLOVE SURG ENC MOIS LTX SZ6 (GLOVE) ×12 IMPLANT
GLOVE SURG ENC MOIS LTX SZ6.5 (GLOVE) ×6 IMPLANT
GOWN STRL REUS W/ TWL LRG LVL3 (GOWN DISPOSABLE) ×8 IMPLANT
GOWN STRL REUS W/TWL LRG LVL3 (GOWN DISPOSABLE) ×21
IRRIG SUCT STRYKERFLOW 2 WTIP (MISCELLANEOUS) ×3
IRRIGATION SUCT STRKRFLW 2 WTP (MISCELLANEOUS) ×2 IMPLANT
IRRIGATOR SUCT 8 DISP DVNC XI (IRRIGATION / IRRIGATOR) IMPLANT
IRRIGATOR SUCTION 8MM XI DISP (IRRIGATION / IRRIGATOR) ×3
KIT TURNOVER KIT A (KITS) ×3 IMPLANT
MANIPULATOR ADVINCU DEL 3.5 PL (MISCELLANEOUS) ×1 IMPLANT
MANIPULATOR UTERINE 4.5 ZUMI (MISCELLANEOUS) ×3 IMPLANT
NEEDLE HYPO 22GX1.5 SAFETY (NEEDLE) ×3 IMPLANT
OBTURATOR OPTICAL STANDARD 8MM (TROCAR) ×3
OBTURATOR OPTICAL STND 8 DVNC (TROCAR) ×2
OBTURATOR OPTICALSTD 8 DVNC (TROCAR) ×2 IMPLANT
PACK ROBOT GYN CUSTOM WL (TRAY / TRAY PROCEDURE) ×3 IMPLANT
PAD POSITIONING PINK XL (MISCELLANEOUS) ×3 IMPLANT
PORT ACCESS TROCAR AIRSEAL 12 (TROCAR) ×2 IMPLANT
PORT ACCESS TROCAR AIRSEAL 5M (TROCAR) ×1
RTRCTR WOUND ALEXIS 18CM SML (INSTRUMENTS) ×3
SAVER CELL AAL HAEMONETICS (INSTRUMENTS) IMPLANT
SCRUB CHG 4% DYNA-HEX 4OZ (MISCELLANEOUS) ×2 IMPLANT
SEAL CANN UNIV 5-8 DVNC XI (MISCELLANEOUS) ×6 IMPLANT
SEAL XI 5MM-8MM UNIVERSAL (MISCELLANEOUS) ×15
SET TRI-LUMEN FLTR TB AIRSEAL (TUBING) ×3 IMPLANT
SPONGE T-LAP 18X18 ~~LOC~~+RFID (SPONGE) ×1 IMPLANT
SUT MNCRL AB 4-0 PS2 18 (SUTURE) ×1 IMPLANT
SUT PDS AB 1 TP1 96 (SUTURE) ×1 IMPLANT
SUT VIC AB 0 CT1 27 (SUTURE) ×6
SUT VIC AB 0 CT1 27XBRD ANTBC (SUTURE) ×2 IMPLANT
SUT VIC AB 2-0 CT1 27 (SUTURE) ×3
SUT VIC AB 2-0 CT1 TAPERPNT 27 (SUTURE) IMPLANT
SUT VIC AB 4-0 PS2 18 (SUTURE) ×7 IMPLANT
SUT VLOC 180 0 9IN  GS21 (SUTURE) ×3
SUT VLOC 180 0 9IN GS21 (SUTURE) ×2 IMPLANT
SYR 20ML LL LF (SYRINGE) ×1 IMPLANT
SYR 50ML LL SCALE MARK (SYRINGE) ×1 IMPLANT
TOWEL OR NON WOVEN STRL DISP B (DISPOSABLE) ×3 IMPLANT
TRAY FOLEY MTR SLVR 16FR STAT (SET/KITS/TRAYS/PACK) ×3 IMPLANT
UNDERPAD 30X36 HEAVY ABSORB (UNDERPADS AND DIAPERS) ×3 IMPLANT
WATER STERILE IRR 1000ML POUR (IV SOLUTION) ×3 IMPLANT
YANKAUER SUCT BULB TIP 10FT TU (MISCELLANEOUS) ×1 IMPLANT

## 2021-04-27 NOTE — Transfer of Care (Signed)
Immediate Anesthesia Transfer of Care Note  Patient: Katrina Barron  Procedure(s) Performed: XI ROBOTIC ASSISTED TOTAL HYSTERECTOMY GREATER THAN 250GRAMS XI ROBOTIC ASSISTED SALPINGECTOMY AND MINI LAPAROTOMY (Bilateral)  Patient Location: PACU  Anesthesia Type:General  Level of Consciousness: awake, alert , oriented and patient cooperative  Airway & Oxygen Therapy: Patient Spontanous Breathing and Patient connected to face mask oxygen  Post-op Assessment: Report given to RN and Post -op Vital signs reviewed and stable  Post vital signs: Reviewed and stable  Last Vitals:  Vitals Value Taken Time  BP 113/73 04/27/21 1133  Temp    Pulse 98 04/27/21 1136  Resp 17 04/27/21 1136  SpO2 100 % 04/27/21 1136  Vitals shown include unvalidated device data.  Last Pain:  Vitals:   04/27/21 0606  TempSrc:   PainSc: 0-No pain         Complications: No notable events documented.

## 2021-04-27 NOTE — Op Note (Signed)
OPERATIVE NOTE 04/27/21  Surgeon: Donaciano Eva   Assistants: Dr Lahoma Crocker (an MD assistant was necessary for tissue manipulation, management of robotic instrumentation, retraction and positioning due to the complexity of the case and hospital policies).   Anesthesia: General endotracheal anesthesia  ASA Class: 3   Pre-operative Diagnosis: fibroids  Post-operative Diagnosis:  same  Operation: Robotic-assisted laparoscopic total hysterectomy >250gm with bilateral salpingectomy   Surgeon: Donaciano Eva  Assistant Surgeon: Lahoma Crocker MD  Anesthesia: GET  Urine Output: 250cc  Operative Findings:  : 26cm fibroid uterus filling abdomen and pelvis (large fibroid on right). Normal appearing peritoneal cavity. Uterine specimen weighted 1740gm. Normal appearing ovaries bilaterally.  Estimated Blood Loss:   100cc       Total IV Fluids: 800 ml         Specimens:  uterus with cervix and left fallopian tube; right fallopian tube.         Complications:  None; patient tolerated the procedure well.         Disposition: PACU - hemodynamically stable.  Procedure Details  The patient was seen in the Holding Room. The risks, benefits, complications, treatment options, and expected outcomes were discussed with the patient.  The patient concurred with the proposed plan, giving informed consent.  The site of surgery properly noted/marked. The patient was identified as Data processing manager and the procedure verified as a Robotic-assisted hysterectomy with bilateral salpingectomy. A Time Out was held and the above information confirmed.  After induction of anesthesia, the patient was draped and prepped in the usual sterile manner. Pt was placed in supine position after anesthesia and draped and prepped in the usual sterile manner. The abdominal drape was placed after the CholoraPrep had been allowed to dry for 3 minutes.  Her arms were tucked to her side with all appropriate  precautions.  The shoulders were stabilized with padded shoulder blocks applied to the acromium processes.  The patient was placed in the semi-lithotomy position in [place].  The perineum was prepped with Betadine. The patient was then prepped. Foley catheter was placed.  A sterile speculum was placed in the vagina.  The cervix was grasped with a single-tooth tenaculum and dilated with Kennon Rounds dilators.  The advincular delineator uterine manipulator with a medium colpotomizer ring was placed without difficulty.  A pneum occluder balloon was placed over the manipulator.  OG tube placement was confirmed and to suction.   Next, a 12 mm skin incision was made 1 cm below the subcostal margin in the midclavicular line.  The 5 mm Optiview port and scope was used for direct entry.  Opening pressure was under 10 mm CO2.  The abdomen was insufflated and the findings were noted as above.   At this point and all points during the procedure, the patient's intra-abdominal pressure did not exceed 15 mmHg. Next, an 40mm skin incision was made in the umbilicus and a right and left port was placed about 10 cm lateral to the robot port on the right and left side.  A fourth arm was placed in the left lower quadrant 2 cm above and superior and medial to the anterior superior iliac spine. A similar additional port was placed in the right mid abdomen. All ports were placed under direct visualization.  Bariatric length ports were used due to the small size of the patient's abdomen. The patient was placed in steep Trendelenburg.  Bowel was folded away into the upper abdomen.  The robot was docked  in the normal manner.  The hysterectomy was started by placing the camera off set to the right to visualize around the fibroid on the right, and the round ligament on the right side was incised and the retroperitoneum was entered and the pararectal space was developed.  The ureter was noted to be on the medial leaf of the broad ligament.   The peritoneum above the ureter was incised and stretched and the utero-ovarian ligament was skeletonized, cauterized and cut.  The fallopian tube was skeletonized off of the ovary and sent separately. The posterior peritoneum was taken down to the level of the KOH ring.  The anterior peritoneum was also taken down.  The bladder flap was created to the level of the KOH ring.  The uterine artery on the right side was skeletonized, cauterized and cut in the normal manner.  A similar procedure was performed on the left. However the left fallopian tube was kept intact with the uterus. The colpotomy was made and the uterus, cervix, bilateral tubes were amputated.  Pedicles were inspected and excellent hemostasis was achieved.    The colpotomy at the vaginal cuff was closed with Vicryl on a CT1 needle in figure of 8's and then #0 v-loc suture in a running manner.  Irrigation was used and excellent hemostasis was achieved.  At this point in the procedure was completed.  Robotic instruments were removed under direct visulaization.  The robot was undocked.   A 6cm suprapubic low transverse incision was made with the scalpel. The subcutaneous skin was opened with the bovie. The fascia was opened with the bovie transversely and the rectus muscles were dissected off of the fascia inferiorally and superiorally. The peritoneum was opened sharply in the midline. The peritoneal incision was extended. An allexis retractor was placed in the incision to protect and retract the edges. The uterine specimen was morcellated in a controlled fashion. The abdomen was manually palpated to ensure no residual fibroid fragments were remaining. The fascia was closed with running 0-vicryl. The subcutaneous fat was closed with 2-0 vicryl. 20cc of exparel mixed with 20cc of marcaine was infiltrated into the incision. The incision was closed at the skin with monocryl and dermabond.   The 10 mm ports were closed with Vicryl on a UR-5 needle and  the fascia was closed with 0 Vicryl on a UR-5 needle.  The skin was closed with 4-0 Vicryl in a subcuticular manner.  Dermabond was applied.  Sponge, lap and needle counts correct x 2.  The patient was taken to the recovery room in stable condition.  The vagina was swabbed with  minimal bleeding noted.   All instrument and needle counts were correct x  3.   The patient was transferred to the recovery room in a stable condition.  Donaciano Eva, MD

## 2021-04-27 NOTE — Anesthesia Procedure Notes (Signed)
Procedure Name: Intubation Date/Time: 04/27/2021 7:35 AM Performed by: Cleda Daub, CRNA Pre-anesthesia Checklist: Patient identified, Emergency Drugs available, Suction available and Patient being monitored Patient Re-evaluated:Patient Re-evaluated prior to induction Oxygen Delivery Method: Circle system utilized Preoxygenation: Pre-oxygenation with 100% oxygen Induction Type: IV induction Ventilation: Mask ventilation without difficulty Laryngoscope Size: Mac and 3 Grade View: Grade I Tube type: Oral Tube size: 7.0 mm Number of attempts: 1 Airway Equipment and Method: Stylet and Oral airway Placement Confirmation: ETT inserted through vocal cords under direct vision, positive ETCO2 and breath sounds checked- equal and bilateral Secured at: 20 cm Tube secured with: Tape Dental Injury: Teeth and Oropharynx as per pre-operative assessment

## 2021-04-27 NOTE — Discharge Instructions (Signed)
Return to work: 4 weeks (2 weeks with physical restrictions).  Activity: 1. Be up and out of the bed during the day.  Take a nap if needed.  You may walk up steps but be careful and use the hand rail.  Stair climbing will tire you more than you think, you may need to stop part way and rest.   2. No lifting or straining for 4 weeks.  3. No driving for 1 weeks.  Do Not drive if you are taking narcotic pain medicine.  4. Shower daily.  Use soap and water on your incision and pat dry; don't rub.   5. No sexual activity and nothing in the vagina for 8 weeks.  Medications:  - Take ibuprofen and tylenol first line for pain control. Take these regularly (every 6 hours) to decrease the build up of pain.  - If necessary, for severe pain not relieved by ibuprofen, contact Dr Serita Grit office and you will be prescribed percocet.  - While taking percocet you should take sennakot every night to reduce the likelihood of constipation. If this causes diarrhea, stop its use.  Diet: 1. Low sodium Heart Healthy Diet is recommended.  2. It is safe to use a laxative if you have difficulty moving your bowels.   Wound Care: 1. Keep clean and dry.  Shower daily.  Reasons to call the Doctor:  Fever - Oral temperature greater than 100.4 degrees Fahrenheit Foul-smelling vaginal discharge Difficulty urinating Nausea and vomiting Increased pain at the site of the incision that is unrelieved with pain medicine. Difficulty breathing with or without chest pain New calf pain especially if only on one side Sudden, continuing increased vaginal bleeding with or without clots.   Follow-up: 1. See Katrina Barron in 4 weeks.  Contacts: For questions or concerns you should contact:  Dr. Everitt Barron at [phone] After hours and on week-ends call [phone] and ask to speak to the physician on call for Gynecologic Oncology

## 2021-04-27 NOTE — Anesthesia Postprocedure Evaluation (Signed)
Anesthesia Post Note  Patient: Katrina Barron  Procedure(s) Performed: XI ROBOTIC ASSISTED TOTAL HYSTERECTOMY GREATER THAN 250GRAMS XI ROBOTIC ASSISTED SALPINGECTOMY AND MINI LAPAROTOMY (Bilateral)     Patient location during evaluation: PACU Anesthesia Type: General Level of consciousness: awake and alert Pain management: pain level controlled Vital Signs Assessment: post-procedure vital signs reviewed and stable Respiratory status: spontaneous breathing, nonlabored ventilation and respiratory function stable Cardiovascular status: stable and blood pressure returned to baseline Anesthetic complications: no   No notable events documented.  Last Vitals:  Vitals:   04/27/21 1245 04/27/21 1310  BP: 119/85 (!) 110/100  Pulse: 81 98  Resp: 15 14  Temp: 36.5 C 36.5 C  SpO2: 100% 100%    Last Pain:  Vitals:   04/27/21 1310  TempSrc:   PainSc: [place]

## 2021-04-28 ENCOUNTER — Encounter (HOSPITAL_COMMUNITY): Payer: Self-pay | Admitting: Gynecologic Oncology

## 2021-04-28 ENCOUNTER — Telehealth: Payer: Self-pay

## 2021-04-28 LAB — SURGICAL PATHOLOGY

## 2021-04-28 NOTE — H&P (Signed)
Follow-up Note: Gyn-Onc  Consult was initially requested by Dr. Matthew Saras for the evaluation of Katrina Barron 35 y.o. female  CC:  Fibroid uterus   Assessment/Plan:  Katrina. Katrina Barron  is a 35 y.o.  year old with a bulky, symptomatic fibroid uterus measuring 22.6cm, s/p lupron with response (decrease in size)   Desires definitive treatment with hysterectomy. She understands that this will result in permanent infertility.  Based on my physical examination today, she is a high likelihood of being a candidate for a minimally invasive approach with mini laparotomy for specimen delivery and morcellation.    She was counseled regarding the risks of the procedure and the potential for conversion to laparotomy. If the procedure goes well she will be discharged same day. She will be off work for 4 weeks.   HPI: Katrina Barron is a 35 year old P1 who was seen in consultation at the request of Dr Matthew Saras for evaluation of a bulky fibroid uterus. 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  The patient has had a bulky fibroid uterus for many years.  This causes mass-effect symptoms of abdominal discomfort and bloating, urinary frequency, and constipation.  She also has menorrhagia, though has not required blood transfusions but does have a documented history of anemia.  The patient had been seen by Dr. Matthew Saras in 2021 and an ultrasound of the uterus was performed on 05/07/2020.  This revealed a uterus measuring 32.2 x 9.9 x 12.9 cm the left ovary was normal and the right ovary not visualized.  This was followed up with an MRI on May 22, 2020 which revealed a uterus measuring 26.3 x 10.5 x 18.3 cm with new numerous intramural, submucosal, and subserosal fibroids.  The ovaries were grossly normal.  She was scheduled for surgery with Dr. Dominga Ferry for an anticipated exploratory laparotomy with total abdominal hysterectomy for October 2021.  This surgery was canceled by the patient when she  was unable to pay the payment plan preoperatively and desired waiting until the new year due to the holiday season.  Endometrial biopsy on 12/17/20 showed benign endometrium with breakdown and no maignancy. Hb was 11.3 on CBC drawn on 3.3.22.  She was planned to undergo robotic assisted total hysterectomy with bilateral salpingectomy and minilaparotomy. She was prescribed lupron for 3 months to illicit fibroid resolution/shrinking. After this she began developing symptoms of vaginal drainage.  Interval Hx: Repeat MRI of the pelvis performed on 03/19/2021 which was 3 months after administration of Lupron revealed a markedly enlarged uterus with diffuse your involvement by fibroids.  There was a mild decrease in size since the previous study due to hemorrhagic degeneration of several fibroids in the fundus.  Uterine dimensions now measured 22.6 x 10.2 x 17.6 cm. She is ready for her hysterectomy.   Current Meds:  Outpatient Encounter Medications as of 03/25/2021  Medication Sig   Ferrous Sulfate (IRON PO) Take 1 tablet by mouth as needed. (Patient not taking: Reported on 12/16/2020)   Multiple Vitamins-Minerals (MULTIVITAMIN WOMEN PO) Take 1 tablet by mouth daily.   valACYclovir (VALTREX) 1000 MG tablet Take 1 tablet (1,000 mg total) by mouth daily. For prevention or 1/2 bid during outbreak X 3days (Patient not taking: Reported on 12/16/2020)   No facility-administered encounter medications on file as of 03/25/2021.    Allergy: No Known Allergies  Social Hx:   Social History   Socioeconomic History   Marital status: Single    Spouse name:  Not on file   Number of children: Not on file   Years of education: Not on file   Highest education level: Not on file  Occupational History   Occupation: HouseKeeper    Employer: [employer]  Tobacco Use   Smoking status: Never   Smokeless tobacco: Never  Vaping Use   Vaping Use: Never used  Substance and Sexual Activity   Alcohol use: No   Drug  use: No   Sexual activity: Not Currently    Birth control/protection: None  Other Topics Concern   Not on file  Social History Narrative   Works as a Secretary/administrator, son was born in 2008 and has SST. She is in a monogamous relationship with her fiance.   Social Determinants of Health   Financial Resource Strain: Not on file  Food Insecurity: Not on file  Transportation Needs: Not on file  Physical Activity: Not on file  Stress: Not on file  Social Connections: Not on file  Intimate Partner Violence: Not on file    Past Surgical Hx:  Past Surgical History:  Procedure Laterality Date   ROBOTIC ASSISTED TOTAL HYSTERECTOMY N/A 04/27/2021   Procedure: XI ROBOTIC ASSISTED TOTAL HYSTERECTOMY GREATER THAN 250GRAMS;  Surgeon: Everitt Amber, MD;  Location: [facility];  Service: Gynecology;  Laterality: N/A;   XI ROBOTIC ASSISTED SALPINGECTOMY Bilateral 04/27/2021   Procedure: XI ROBOTIC ASSISTED SALPINGECTOMY AND MINI LAPAROTOMY;  Surgeon: Everitt Amber, MD;  Location: [facility];  Service: Gynecology;  Laterality: Bilateral;    Past Medical Hx:  Past Medical History:  Diagnosis Date   Anemia    Genital herpes simplex    Headache     Past Gynecological History:  See HPI, SVD x 1, fibroid uterus Patient's last menstrual period was 04/06/2021 (within days).  Family Hx:  Family History  Problem Relation Age of Onset   Sickle cell trait Son    Cancer Maternal Grandmother    Diabetes Maternal Grandfather    Colon cancer Neg Hx    Breast cancer Neg Hx    Ovarian cancer Neg Hx    Endometrial cancer Neg Hx    Prostate cancer Neg Hx    Pancreatic cancer Neg Hx     Review of Systems:  Constitutional  Feels well,    ENT Normal appearing ears and nares bilaterally Skin/Breast  No rash, sores, jaundice, itching, dryness Cardiovascular  No chest pain, shortness of breath, or edema  Pulmonary  No cough or wheeze.  Gastro Intestinal  No nausea, vomitting, or diarrhoea. No bright red blood per  rectum, no abdominal pain, change in bowel movement, +constipation.  Genito Urinary  No frequency, urgency, dysuria, + urinary frequency, + menorrhagia Musculo Skeletal  No myalgia, arthralgia, joint swelling or pain  Neurologic  No weakness, numbness, change in gait,  Psychology  No depression, anxiety, insomnia.   Vitals:  Blood pressure 110/70, pulse 96, temperature 97.7 F (36.5 C), resp. rate 14, height 5\' 3"  (1.6 m), weight 119 lb 0.8 oz (54 kg), last menstrual period 04/06/2021, SpO2 100 %.  Physical Exam: WD in NAD Neck  Supple NROM, without any enlargements.  Lymph Node Survey No cervical supraclavicular or inguinal adenopathy Cardiovascular  Well perfused peripheries Lungs  No increased WOB Skin  No rash/lesions/breakdown  Psychiatry  Alert and oriented to person, place, and time  Abdomen  Normoactive bowel sounds, abdomen soft, non-tender and thin without evidence of hernia. Large uterus filling the abdomen, minimally mobile, spans width of abdomen. Back No  CVA tenderness Genito Urinary  Vulva/vagina: Normal external female genitalia.  No lesions. No discharge or bleeding.  Bladder/urethra:  No lesions or masses, well supported bladder  Vagina: normal  Cervix: Normal appearing, no lesions.  Uterus:Bulky, mobile cervix and lower uterine segment, uterus sits relatively high in pelvis, uterus fills entire abdomen to close to xiphoid.   Adnexa: no discretely palpable masses. Rectal  Deferred  Extremities  No bilateral cyanosis, clubbing or edema.   Thereasa Solo, MD  04/28/2021, 12:59 PM

## 2021-04-28 NOTE — Telephone Encounter (Addendum)
Spoke with Ms. Passion this morning. She states she is eating, drinking and urinating well. She has not had a BM yet but is passing gas. She is to increase the  senokot -s to 2 tabs bid.with 8 oz of water.  If no good BM in the am she can add a capful of Miralax bid. Encouraged her to drink plenty of water. She denies fever or chills. Incisions are dry and intact. Instructed hert o remove the honey comb dressing on Sunday 05-02-21. She does not need to apply a new dressing.  Her pain is controlled with Tramadol and ibuprofen.  She will get extra strength tylenol to take 2 tabs every 6 hours alt. with Ibuprofen.   Instructed to call office with any fever, chills, purulent drainage, uncontrolled pain or any other questions or concerns. Patient verbalizes understanding.  Pt aware of post op appointments as well as the office number [phone] and after hours number [phone] to call if she has any questions or concerns

## 2021-04-28 NOTE — Telephone Encounter (Signed)
Told Katrina Barron that the final surgical pathology showed no cancer just the fibroids. Pt verbalized understanding.

## 2021-04-28 NOTE — Telephone Encounter (Signed)
LM for Ms Preuss to call back to the office [phone] to discuss how she is doing after her surgery yesterday.

## 2021-05-11 ENCOUNTER — Telehealth: Payer: Self-pay | Admitting: *Deleted

## 2021-05-11 NOTE — Telephone Encounter (Signed)
Katrina Barron called this morning and reports some L lower swelling in her pelvic area She denies fever, chills or redness around the incision. She had a BM yesterday and states that she had a little burning with urination, but may not be drinking enough fluid. She is having some clear vaginal discharge. She applied ice to the area of swelling and reports that the swelling improved. She has some pain in the area that is controled mostly with tylenol and ibuprofen, rarely using tramadol for the pain.

## 2021-05-11 NOTE — Telephone Encounter (Signed)
Dr. Denman George notified of Katrina Barron's symptoms. Per Dr. Denman George Ms. Zepf can f/u on 8/2 unless there are any new problems or concerns.

## 2021-05-14 NOTE — Progress Notes (Addendum)
..  The following Assist/Replace Program for Lupron from [facility] [place] has been terminated due to Patient opted to have surgery no longer needing medication for treatment.  Last DOS: 01/15/2021. Marland KitchenJuan Quam, CPhT IV Drug Replacement Specialist [place] Phone: [phone]

## 2021-05-18 ENCOUNTER — Inpatient Hospital Stay: Payer: Managed Care, Other (non HMO) | Attending: Gynecologic Oncology | Admitting: Gynecologic Oncology

## 2021-05-18 ENCOUNTER — Encounter: Payer: Self-pay | Admitting: Gynecologic Oncology

## 2021-05-18 ENCOUNTER — Other Ambulatory Visit: Payer: Self-pay

## 2021-05-18 VITALS — BP 123/85 | HR 81 | Temp 98.3°F | Resp 18 | Ht 63.0 in | Wt 115.0 lb

## 2021-05-18 DIAGNOSIS — Z90710 Acquired absence of both cervix and uterus: Secondary | ICD-10-CM | POA: Diagnosis not present

## 2021-05-18 DIAGNOSIS — D250 Submucous leiomyoma of uterus: Secondary | ICD-10-CM | POA: Diagnosis present

## 2021-05-18 DIAGNOSIS — D251 Intramural leiomyoma of uterus: Secondary | ICD-10-CM | POA: Diagnosis present

## 2021-05-18 DIAGNOSIS — Z90722 Acquired absence of ovaries, bilateral: Secondary | ICD-10-CM | POA: Diagnosis not present

## 2021-05-18 NOTE — Patient Instructions (Signed)
You are cleared to return to work on August 9th without restrictions. Please follow-up with your gynecologist in 1 year.  Dr Denman George recommends no sex for 1 more month.

## 2021-05-18 NOTE — Progress Notes (Signed)
Follow-up Note: Gyn-Onc  Consult was initially requested by Dr. Matthew Saras for the evaluation of Katrina Barron 35 y.o. female  CC:  Chief Complaint  Patient presents with   Intramural and submucous leiomyoma of uterus    Assessment/Plan:  Katrina Barron  is a 35 y.o.  year old with a bulky, symptomatic fibroid uterus measuring 22.6cm, s/p robotic assisted total hysterectomy, bilateral salpingectomy on 04/27/21. Benign pathology.  Recommend ongoing follow-up with her gynecologic provider.   HPI: Katrina Barron is a 35 year old P1 who was seen in consultation at the request of Dr Matthew Saras for evaluation of a bulky fibroid uterus. 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  The patient has had a bulky fibroid uterus for many years.  This causes mass-effect symptoms of abdominal discomfort and bloating, urinary frequency, and constipation.  She also has menorrhagia, though has not required blood transfusions but does have a documented history of anemia.  The patient had been seen by Dr. Matthew Saras in 2021 and an ultrasound of the uterus was performed on 05/07/2020.  This revealed a uterus measuring 32.2 x 9.9 x 12.9 cm the left ovary was normal and the right ovary not visualized.  This was followed up with an MRI on May 22, 2020 which revealed a uterus measuring 26.3 x 10.5 x 18.3 cm with new numerous intramural, submucosal, and subserosal fibroids.  The ovaries were grossly normal.  She was scheduled for surgery with Dr. Dominga Ferry for an anticipated exploratory laparotomy with total abdominal hysterectomy for October 2021.  This surgery was canceled by the patient when she was unable to pay the payment plan preoperatively and desired waiting until the new year due to the holiday season.  Endometrial biopsy on 12/17/20 showed benign endometrium with breakdown and no maignancy. Hb was 11.3 on CBC drawn on 3.3.22.  She was planned to undergo robotic assisted total hysterectomy with  bilateral salpingectomy and minilaparotomy. She was prescribed lupron for 3 months to illicit fibroid resolution/shrinking. After this she began developing symptoms of vaginal drainage.  Interval Hx: Repeat MRI of the pelvis performed on 03/19/2021 which was 3 months after administration of Lupron revealed a markedly enlarged uterus with diffuse your involvement by fibroids.  There was a mild decrease in size since the previous study due to hemorrhagic degeneration of several fibroids in the fundus.  Uterine dimensions now measured 22.6 x 10.2 x 17.6 cm. She is ready for her hysterectomy.   On 04/27/21 she underwent robotic assisted total hysterectomy >250gm, bilateral salpingectomy. Intraoperative findings were significant for 26 cm fibroid uterus filling the abdomen and pelvis with a large fibroid on the right.  Normal-appearing peritoneal cavity.  Uterine specimen weighed 1740 g.  Normal ovaries bilaterally. Surgery was uncomplicated.  She required a mini laparotomy for specimen delivery.  Final pathology revealed benign leiomyomata, benign fallopian tubes, benign endometrium.  Since surgery she has had complaints of mild suprapubic discomfort.   Current Meds:  Outpatient Encounter Medications as of 05/18/2021  Medication Sig   ibuprofen (ADVIL) 800 MG tablet Take 1 tablet (800 mg total) by mouth every 8 (eight) hours as needed for moderate pain. For AFTER surgery only   Multiple Vitamins-Minerals (MULTIVITAMIN WOMEN PO) Take 1 tablet by mouth daily.   senna-docusate (SENOKOT-S) 8.6-50 MG tablet Take 2 tablets by mouth at bedtime. For AFTER surgery, do not take if having diarrhea   traMADol (ULTRAM) 50 MG tablet Take 1 tablet (50 mg total) by  mouth every 6 (six) hours as needed for severe pain. For AFTER surgery only, do not take and drive   valACYclovir (VALTREX) 1000 MG tablet Take 1 tablet (1,000 mg total) by mouth daily. For prevention or 1/2 bid during outbreak X 3days (Patient taking  differently: Take 500 mg by mouth 2 (two) times daily as needed (outbreaks). For prevention or 1/2 bid during outbreak X 3days)   No facility-administered encounter medications on file as of 05/18/2021.    Allergy: No Known Allergies  Social Hx:   Social History   Socioeconomic History   Marital status: Single    Spouse name: Not on file   Number of children: Not on file   Years of education: Not on file   Highest education level: Not on file  Occupational History   Occupation: HouseKeeper    Employer: [employer]  Tobacco Use   Smoking status: Never   Smokeless tobacco: Never  Vaping Use   Vaping Use: Never used  Substance and Sexual Activity   Alcohol use: No   Drug use: No   Sexual activity: Not Currently    Birth control/protection: None  Other Topics Concern   Not on file  Social History Narrative   Works as a Secretary/administrator, son was born in 2008 and has SST. She is in a monogamous relationship with her fiance.   Social Determinants of Health   Financial Resource Strain: Not on file  Food Insecurity: Not on file  Transportation Needs: Not on file  Physical Activity: Not on file  Stress: Not on file  Social Connections: Not on file  Intimate Partner Violence: Not on file    Past Surgical Hx:  Past Surgical History:  Procedure Laterality Date   ROBOTIC ASSISTED TOTAL HYSTERECTOMY N/A 04/27/2021   Procedure: XI ROBOTIC ASSISTED TOTAL HYSTERECTOMY GREATER THAN 250GRAMS;  Surgeon: Everitt Amber, MD;  Location: [facility];  Service: Gynecology;  Laterality: N/A;   XI ROBOTIC ASSISTED SALPINGECTOMY Bilateral 04/27/2021   Procedure: XI ROBOTIC ASSISTED SALPINGECTOMY AND MINI LAPAROTOMY;  Surgeon: Everitt Amber, MD;  Location: [facility];  Service: Gynecology;  Laterality: Bilateral;    Past Medical Hx:  Past Medical History:  Diagnosis Date   Anemia    Genital herpes simplex    Headache     Past Gynecological History:  See HPI, SVD x 1, fibroid uterus No LMP  recorded.  Family Hx:  Family History  Problem Relation Age of Onset   Sickle cell trait Son    Cancer Maternal Grandmother    Diabetes Maternal Grandfather    Colon cancer Neg Hx    Breast cancer Neg Hx    Ovarian cancer Neg Hx    Endometrial cancer Neg Hx    Prostate cancer Neg Hx    Pancreatic cancer Neg Hx     Review of Systems:  Constitutional  Feels well,    ENT Normal appearing ears and nares bilaterally Skin/Breast  No rash, sores, jaundice, itching, dryness Cardiovascular  No chest pain, shortness of breath, or edema  Pulmonary  No cough or wheeze.  Gastro Intestinal  No nausea, vomitting, or diarrhoea. No bright red blood per rectum, no abdominal pain, change in bowel movement, +constipation.  Genito Urinary  No frequency, urgency, dysuria, + urinary frequency, + menorrhagia Musculo Skeletal  No myalgia, arthralgia, joint swelling or pain  Neurologic  No weakness, numbness, change in gait,  Psychology  No depression, anxiety, insomnia.   Vitals:  Blood pressure 123/85, pulse 81, temperature  98.3 F (36.8 C), temperature source Oral, resp. rate 18, height '5\' 3"'$  (1.6 m), weight 115 lb (52.2 kg), SpO2 96 %.  Physical Exam: WD in NAD Neck  Supple NROM, without any enlargements.  Lymph Node Survey No cervical supraclavicular or inguinal adenopathy Cardiovascular  Well perfused peripheries Lungs  No increased WOB Skin  No rash/lesions/breakdown  Psychiatry  Alert and oriented to person, place, and time  Abdomen  Normoactive bowel sounds, abdomen soft, non-tender and thin without evidence of hernia. Incisions all well healed without evidence of infection or wound separation.  Back No CVA tenderness Genito Urinary  Patient extremely fearful of speculum exam and refused speculum. Tensed pelvic floor muscles extremely during bimanual exam making exam limited, however, vaginal cuff palpably in tact with no bleeding Rectal  Deferred  Extremities  No  bilateral cyanosis, clubbing or edema.   Thereasa Solo, MD  05/18/2021, 2:22 PM

## 2021-06-09 ENCOUNTER — Ambulatory Visit: Payer: Managed Care, Other (non HMO) | Admitting: Gynecologic Oncology

## 2021-07-19 ENCOUNTER — Emergency Department (HOSPITAL_COMMUNITY)
Admission: EM | Admit: 2021-07-19 | Discharge: 2021-07-19 | Disposition: A | Discharge status: Home / Self Care | Payer: Managed Care, Other (non HMO) | Attending: Emergency Medicine | Admitting: Emergency Medicine

## 2021-07-19 ENCOUNTER — Encounter (HOSPITAL_COMMUNITY): Payer: Self-pay

## 2021-07-19 DIAGNOSIS — K1379 Other lesions of oral mucosa: Secondary | ICD-10-CM

## 2021-07-19 DIAGNOSIS — H905 Unspecified sensorineural hearing loss: Secondary | ICD-10-CM | POA: Insufficient documentation

## 2021-07-19 DIAGNOSIS — K1329 Other disturbances of oral epithelium, including tongue: Secondary | ICD-10-CM | POA: Diagnosis present

## 2021-07-19 NOTE — ED Provider Notes (Signed)
[place] DEPT Provider Note   CSN: [id number] Arrival date & time: 07/19/21  6294     History Chief Complaint  Patient presents with   Tongue Issue    Katrina Barron is a 35 y.o. female.  35 year old female presents with tongue paresthesias times several months.  Does have a history of herpes.  Denies any change to her taste.  Has had some hearing issues and was scheduled to see an ENT at [facility] but did not make that appointment.  Denies any facial paresthesias.  No facial droop noted.  States that symptoms have been persistent and relieved with ibuprofen.      Past Medical History:  Diagnosis Date   Anemia    Genital herpes simplex    Headache     Patient Active Problem List   Diagnosis Date Noted   Intramural and submucous leiomyoma of uterus 12/17/2020   Menorrhagia with regular cycle 12/17/2020   Anemia 01/17/2013    Past Surgical History:  Procedure Laterality Date   ROBOTIC ASSISTED TOTAL HYSTERECTOMY N/A 04/27/2021   Procedure: XI ROBOTIC ASSISTED TOTAL HYSTERECTOMY GREATER THAN 250GRAMS;  Surgeon: Everitt Amber, MD;  Location: [facility];  Service: Gynecology;  Laterality: N/A;   XI ROBOTIC ASSISTED SALPINGECTOMY Bilateral 04/27/2021   Procedure: XI ROBOTIC ASSISTED SALPINGECTOMY AND MINI LAPAROTOMY;  Surgeon: Everitt Amber, MD;  Location: [facility];  Service: Gynecology;  Laterality: Bilateral;     OB History     Gravida  1   Para  1   Term  1   Preterm      AB      Living         SAB      IAB      Ectopic      Multiple      Live Births              Family History  Problem Relation Age of Onset   Sickle cell trait Son    Cancer Maternal Grandmother    Diabetes Maternal Grandfather    Colon cancer Neg Hx    Breast cancer Neg Hx    Ovarian cancer Neg Hx    Endometrial cancer Neg Hx    Prostate cancer Neg Hx    Pancreatic cancer Neg Hx     Social History   Tobacco Use   Smoking status: Never    Smokeless tobacco: Never  Vaping Use   Vaping Use: Never used  Substance Use Topics   Alcohol use: No   Drug use: No    Home Medications Prior to Admission medications   Medication Sig Start Date End Date Taking? Authorizing Provider  ibuprofen (ADVIL) 800 MG tablet Take 1 tablet (800 mg total) by mouth every 8 (eight) hours as needed for moderate pain. For AFTER surgery only 03/25/21   Joylene John D, NP  Multiple Vitamins-Minerals (MULTIVITAMIN WOMEN PO) Take 1 tablet by mouth daily.    [provider]  senna-docusate (SENOKOT-S) 8.6-50 MG tablet Take 2 tablets by mouth at bedtime. For AFTER surgery, do not take if having diarrhea 03/25/21   Joylene John D, NP  traMADol (ULTRAM) 50 MG tablet Take 1 tablet (50 mg total) by mouth every 6 (six) hours as needed for severe pain. For AFTER surgery only, do not take and drive 04/[birth year]   Cross, Lenna Sciara D, NP  valACYclovir (VALTREX) 1000 MG tablet Take 1 tablet (1,000 mg total) by mouth daily. For prevention or 1/2  bid during outbreak X 3days Patient taking differently: Take 500 mg by mouth 2 (two) times daily as needed (outbreaks). For prevention or 1/2 bid during outbreak X 3days 03/25/21   Joylene John D, NP    Allergies    Patient has no known allergies.  Review of Systems   Review of Systems  All other systems reviewed and are negative.  Physical Exam Updated Vital Signs BP 115/86 (BP Location: Right Arm)   Pulse (!) 104   Temp 98 F (36.7 C) (Oral)   Resp 18   SpO2 100%   Physical Exam Vitals and nursing note reviewed.  Constitutional:      General: She is not in acute distress.    Appearance: Normal appearance. She is well-developed. She is not toxic-appearing.  HENT:     Head: Normocephalic and atraumatic.  Eyes:     General: Lids are normal.     Conjunctiva/sclera: Conjunctivae normal.     Pupils: Pupils are equal, round, and reactive to light.  Neck:     Thyroid: No thyroid mass.     Trachea: No tracheal  deviation.  Cardiovascular:     Rate and Rhythm: Normal rate and regular rhythm.     Heart sounds: Normal heart sounds. No murmur heard.   No gallop.  Pulmonary:     Effort: Pulmonary effort is normal. No respiratory distress.     Breath sounds: Normal breath sounds. No stridor. No decreased breath sounds, wheezing, rhonchi or rales.  Abdominal:     General: There is no distension.     Palpations: Abdomen is soft.     Tenderness: There is no abdominal tenderness. There is no rebound.  Musculoskeletal:        General: No tenderness. Normal range of motion.     Cervical back: Normal range of motion and neck supple.  Skin:    General: Skin is warm and dry.     Findings: No abrasion or rash.  Neurological:     Mental Status: She is alert and oriented to person, place, and time. Mental status is at baseline.     GCS: GCS eye subscore is 4. GCS verbal subscore is 5. GCS motor subscore is 6.     Cranial Nerves: Cranial nerves are intact. No cranial nerve deficit.     Sensory: No sensory deficit.     Motor: Motor function is intact.  Psychiatric:        Attention and Perception: Attention normal.        Speech: Speech normal.        Behavior: Behavior normal.    ED Results / Procedures / Treatments   Labs (all labs ordered are listed, but only abnormal results are displayed) Labs Reviewed - No data to display  EKG None  Radiology No results found.  Procedures Procedures   Medications Ordered in ED Medications - No data to display  ED Course  I have reviewed the triage vital signs and the nursing notes.  Pertinent labs & imaging results that were available during my care of the patient were reviewed by me and considered in my medical decision making (see chart for details).    MDM Rules/Calculators/A&P                           No evidence of intraoral lesions noted.  Will have patient follow-up with her ENT specialist Final Clinical Impression(s) / ED  Diagnoses Final diagnoses:  None    Rx / DC Orders ED Discharge Orders     None        Lacretia Leigh, MD 07/19/21 [phone]

## 2021-07-19 NOTE — ED Triage Notes (Signed)
Pt arrived via POV, c/o tongue numbness for several months. Able to speak/swallow and eat without issue.

## 2021-07-19 NOTE — Discharge Instructions (Addendum)
Follow-up with your ENT doctor at [facility]

## 2021-08-23 ENCOUNTER — Telehealth: Payer: Self-pay

## 2021-08-23 NOTE — Telephone Encounter (Signed)
Katrina Barron wanted to know if her labs were ok preoperatively. Told her they were WNL. She is having numbness in her tongue.  Told her that she would need to see her PCP to be evaluated.  She wondered if she needed to see her dentist.  Inquired if she were experiencing dental issues. She said yes.  Told her that she needs to call the dentist to be evaluated or her PCP. Pt verbalized understanding.

## 2023-12-29 ENCOUNTER — Encounter: Payer: Self-pay | Admitting: Emergency Medicine

## 2023-12-29 ENCOUNTER — Ambulatory Visit
Admission: EM | Admit: 2023-12-29 | Discharge: 2023-12-29 | Disposition: A | Discharge status: Home / Self Care | Payer: Self-pay

## 2023-12-29 ENCOUNTER — Encounter: Payer: Self-pay | Admitting: Gynecologic Oncology

## 2023-12-29 DIAGNOSIS — S39012A Strain of muscle, fascia and tendon of lower back, initial encounter: Secondary | ICD-10-CM

## 2023-12-29 DIAGNOSIS — Z862 Personal history of diseases of the blood and blood-forming organs and certain disorders involving the immune mechanism: Secondary | ICD-10-CM | POA: Insufficient documentation

## 2023-12-29 DIAGNOSIS — D219 Benign neoplasm of connective and other soft tissue, unspecified: Secondary | ICD-10-CM | POA: Insufficient documentation

## 2023-12-29 DIAGNOSIS — A6000 Herpesviral infection of urogenital system, unspecified: Secondary | ICD-10-CM | POA: Insufficient documentation

## 2023-12-29 LAB — POCT URINALYSIS DIP (MANUAL ENTRY)
Bilirubin, UA: NEGATIVE
Blood, UA: NEGATIVE
Glucose, UA: NEGATIVE mg/dL
Ketones, POC UA: NEGATIVE mg/dL
Leukocytes, UA: NEGATIVE
Nitrite, UA: NEGATIVE
Protein Ur, POC: NEGATIVE mg/dL
Spec Grav, UA: 1.015 (ref 1.010–1.025)
Urobilinogen, UA: 0.2 U/dL
pH, UA: 8 (ref 5.0–8.0)

## 2023-12-29 MED ORDER — BACLOFEN 10 MG PO TABS: 10.0000 mg | ORAL_TABLET | Freq: Three times a day (TID) | ORAL | 0 refills | Status: AC

## 2023-12-29 NOTE — ED Provider Notes (Addendum)
 [facility]    CSN: [id number] Arrival date & time: 12/29/23  1729      History   Chief Complaint Chief Complaint  Patient presents with   Back Pain   Abdominal Pain    HPI Katrina Barron is a 38 y.o. female.   Patient presents to urgent care for evaluation of pain to the bilateral lower back that started approximately 1 week ago without recent trauma or injury.  Pain intermittently radiates to the bilateral lower abdomen and is worsened by movement.  Pain improves slightly with rest and heat.  Patient is a truck driver for living and believes that this is making the pain worse due to positioning in her chair on the truck and bouncing in the truck seat.  She drives long distances which further exacerbates pain from inability to stretch and walk around. No fall, trauma, numbness or tingling, saddle paresthesia, changes to bowel or urinary habits, extremity weakness, radicular symptoms.  Reports mild dysuria without any urinary frequency, urgency, hesitancy, or gross hematuria.  No flank pain, nausea, vomiting, or diarrhea.  No recent blood or mucus in the stools.  She does report constipation for the last few days which has been resolved with Dulcolax successfully.  Denies history of spinal problems/surgeries.  She has not attempted other treatments for low back pain at home.    Back Pain Associated symptoms: abdominal pain   Abdominal Pain   Past Medical History:  Diagnosis Date   Anemia    Genital herpes simplex    Headache     Patient Active Problem List   Diagnosis Date Noted   Genital herpes simplex 12/29/2023   History of anemia 12/29/2023   Leiomyoma 12/29/2023   Intramural and submucous leiomyoma of uterus 12/17/2020   Menorrhagia with regular cycle 12/17/2020   Anemia 01/17/2013    Past Surgical History:  Procedure Laterality Date   ROBOTIC ASSISTED TOTAL HYSTERECTOMY N/A 04/27/2021   Procedure: XI ROBOTIC ASSISTED TOTAL HYSTERECTOMY GREATER THAN  250GRAMS;  Surgeon: Adolphus Birchwood, MD;  Location: [facility];  Service: Gynecology;  Laterality: N/A;   XI ROBOTIC ASSISTED SALPINGECTOMY Bilateral 04/27/2021   Procedure: XI ROBOTIC ASSISTED SALPINGECTOMY AND MINI LAPAROTOMY;  Surgeon: Adolphus Birchwood, MD;  Location: [facility];  Service: Gynecology;  Laterality: Bilateral;    OB History     Gravida  1   Para  1   Term  1   Preterm      AB      Living         SAB      IAB      Ectopic      Multiple      Live Births               Home Medications    Prior to Admission medications   Medication Sig Start Date End Date Taking? Authorizing Provider  baclofen (LIORESAL) 10 MG tablet Take 1 tablet (10 mg total) by mouth 3 (three) times daily. 12/29/23  Yes Carlisle Beers, FNP  Multiple Vitamins-Minerals (MULTIVITAMIN WOMEN PO) Take 1 tablet by mouth daily.   Yes [provider]  ibuprofen (ADVIL) 800 MG tablet Take 1 tablet (800 mg total) by mouth every 8 (eight) hours as needed for moderate pain. For AFTER surgery only 03/25/21   Cross, Efraim Kaufmann D, NP  senna-docusate (SENOKOT-S) 8.6-50 MG tablet Take 2 tablets by mouth at bedtime. For AFTER surgery, do not take if having diarrhea Patient not taking: Reported  on 12/29/2023 03/25/21   Warner Mccreedy D, NP  traMADol (ULTRAM) 50 MG tablet Take 1 tablet (50 mg total) by mouth every 6 (six) hours as needed for severe pain. For AFTER surgery only, do not take and drive Patient not taking: Reported on 12/29/2023 03/25/21   Warner Mccreedy D, NP  valACYclovir (VALTREX) 1000 MG tablet Take 1 tablet (1,000 mg total) by mouth daily. For prevention or 1/2 bid during outbreak X 3days Patient taking differently: Take 500 mg by mouth 2 (two) times daily as needed (outbreaks). For prevention or 1/2 bid during outbreak X 3days 03/25/21   Warner Mccreedy D, NP  valACYclovir (VALTREX) 500 MG tablet TAKE 1 BY MOUTH AS NEEDED    [provider]    Family History Family History  Problem  Relation Age of Onset   Sickle cell trait Son    Cancer Maternal Grandmother    Diabetes Maternal Grandfather    Colon cancer Neg Hx    Breast cancer Neg Hx    Ovarian cancer Neg Hx    Endometrial cancer Neg Hx    Prostate cancer Neg Hx    Pancreatic cancer Neg Hx     Social History Social History   Tobacco Use   Smoking status: Never   Smokeless tobacco: Never  Vaping Use   Vaping status: Never Used  Substance Use Topics   Alcohol use: No   Drug use: No     Allergies   Patient has no known allergies.   Review of Systems Review of Systems  Gastrointestinal:  Positive for abdominal pain.  Musculoskeletal:  Positive for back pain.  Per HPI   Physical Exam Triage Vital Signs ED Triage Vitals  Encounter Vitals Group     BP 12/29/23 1757 111/74     Systolic BP Percentile --      Diastolic BP Percentile --      Pulse Rate 12/29/23 1757 82     Resp 12/29/23 1757 16     Temp 12/29/23 1757 97.8 F (36.6 C)     Temp Source 12/29/23 1757 Oral     SpO2 12/29/23 1757 99 %     Weight --      Height --      Head Circumference --      Peak Flow --      Pain Score 12/29/23 1758 5     Pain Loc --      Pain Education --      Exclude from Growth Chart --    No data found.  Updated Vital Signs BP 111/74 (BP Location: Left Arm)   Pulse 82   Temp 97.8 F (36.6 C) (Oral)   Resp 16   LMP  (LMP Unknown)   SpO2 99%   Visual Acuity Right Eye Distance:   Left Eye Distance:   Bilateral Distance:    Right Eye Near:   Left Eye Near:    Bilateral Near:     Physical Exam Vitals and nursing note reviewed.  Constitutional:      Appearance: She is not ill-appearing or toxic-appearing.  HENT:     Head: Normocephalic and atraumatic.     Right Ear: Hearing and external ear normal.     Left Ear: Hearing and external ear normal.     Nose: Nose normal.     Mouth/Throat:     Lips: Pink.  Eyes:     General: Lids are normal. Vision grossly intact. Gaze aligned  appropriately.  Extraocular Movements: Extraocular movements intact.     Conjunctiva/sclera: Conjunctivae normal.  Cardiovascular:     Rate and Rhythm: Normal rate and regular rhythm.     Heart sounds: Normal heart sounds, S1 normal and S2 normal.  Pulmonary:     Effort: Pulmonary effort is normal. No respiratory distress.     Breath sounds: Normal breath sounds and air entry.  Abdominal:     General: Bowel sounds are normal.     Palpations: Abdomen is soft.     Tenderness: There is no abdominal tenderness. There is no right CVA tenderness, left CVA tenderness or guarding.  Musculoskeletal:     Cervical back: Normal and neck supple.     Thoracic back: Normal.     Lumbar back: Tenderness present. No swelling, edema, deformity, signs of trauma, lacerations, spasms or bony tenderness. Normal range of motion. Negative right straight leg raise test and negative left straight leg raise test. No scoliosis.       Back:  Skin:    General: Skin is warm and dry.     Capillary Refill: Capillary refill takes less than 2 seconds.     Findings: No rash.  Neurological:     General: No focal deficit present.     Mental Status: She is alert and oriented to person, place, and time. Mental status is at baseline.     GCS: GCS eye subscore is 4. GCS verbal subscore is 5. GCS motor subscore is 6.     Cranial Nerves: Cranial nerves 2-12 are intact. No dysarthria or facial asymmetry.     Sensory: Sensation is intact.     Motor: Motor function is intact. No weakness, tremor, abnormal muscle tone or pronator drift.     Coordination: Coordination is intact. Romberg sign negative. Coordination normal. Finger-Nose-Finger Test normal.     Gait: Gait is intact.     Comments: Strength and sensation intact to bilateral upper and lower extremities (5/5). Moves all 4 extremities with normal coordination voluntarily. Non-focal neuro exam.   Psychiatric:        Mood and Affect: Mood normal.        Speech: Speech  normal.        Behavior: Behavior normal.        Thought Content: Thought content normal.        Judgment: Judgment normal.      UC Treatments / Results  Labs (all labs ordered are listed, but only abnormal results are displayed) Labs Reviewed  POCT URINALYSIS DIP (MANUAL ENTRY) - Abnormal; Notable for the following components:      Result Value   Clarity, UA cloudy (*)    All other components within normal limits    EKG   Radiology No results found.  Procedures Procedures (including critical care time)  Medications Ordered in UC Medications - No data to display  Initial Impression / Assessment and Plan / UC Course  I have reviewed the triage vital signs and the nursing notes.  Pertinent labs & imaging results that were available during my care of the patient were reviewed by me and considered in my medical decision making (see chart for details).   1.  Low back strain Evaluation suggests pain is musculoskeletal in nature. Will manage this with rest, gentle ROM exercises, heat therapy, ibuprofen as needed for pain, and as needed use of muscle relaxer. Drowsiness precautions discussed regarding muscle relaxer use. Imaging: no indication for imaging based on stable musculoskeletal exam findings May follow-up  with orthopedics as needed. Work/school excise note given. Urinalysis is unremarkable for signs of urinary tract infection.  Counseled patient on potential for adverse effects with medications prescribed/recommended today, strict ER and return-to-clinic precautions discussed, patient verbalized understanding.    Final Clinical Impressions(s) / UC Diagnoses   Final diagnoses:  Low back strain, initial encounter     Discharge Instructions      Your pain is likely due to a muscle strain which will improve on its own with time.   - You may take over the counter medicines to help with pain.  - You may also take the prescribed muscle relaxer as directed as needed  for muscle aches/spasm.  Do not take this medication and drive or drink alcohol as it can make you sleepy.  Mainly use this medicine at nighttime as needed. - Apply heat 20 minutes on then 20 minutes off and perform gentle range of motion exercises to the area of greatest pain to prevent muscle stiffness and provide further pain relief.   Red flag symptoms to watch out for are numbness/tingling to the legs, weakness, loss of bowel/bladder control, and/or worsening pain that does not respond well to medicines. Follow-up with your primary care provider or return to urgent care if your symptoms do not improve in the next 3 to 4 days with medications and interventions recommended today. If your symptoms are severe (red flag), please go to the emergency room.      ED Prescriptions     Medication Sig Dispense Auth. Provider   baclofen (LIORESAL) 10 MG tablet Take 1 tablet (10 mg total) by mouth 3 (three) times daily. 30 each Carlisle Beers, FNP      PDMP not reviewed this encounter.   Carlisle Beers, FNP 12/29/23 1904    Carlisle Beers, FNP 12/29/23 1904

## 2023-12-29 NOTE — Discharge Instructions (Addendum)
 Your pain is likely due to a muscle strain which will improve on its own with time.   - You may take over the counter medicines to help with pain.  - You may also take the prescribed muscle relaxer as directed as needed for muscle aches/spasm.  Do not take this medication and drive or drink alcohol as it can make you sleepy.  Mainly use this medicine at nighttime as needed. - Apply heat 20 minutes on then 20 minutes off and perform gentle range of motion exercises to the area of greatest pain to prevent muscle stiffness and provide further pain relief.   Red flag symptoms to watch out for are numbness/tingling to the legs, weakness, loss of bowel/bladder control, and/or worsening pain that does not respond well to medicines. Follow-up with your primary care provider or return to urgent care if your symptoms do not improve in the next 3 to 4 days with medications and interventions recommended today. If your symptoms are severe (red flag), please go to the emergency room.

## 2023-12-29 NOTE — ED Triage Notes (Signed)
 Pt reports abdominal and low back pain x1 week. Has recurrent episodes over the last several months, but has become severe pain. LBP is throbbing and burning sensation constantly. Abdominal pain is an aching pain on L side that occasionally radiates across the abdomen. Pt is a truck driver constantly on the road. Pt reports dysuria with burning sensation. Has hx of UTIs. Pt has hysterectomy. Not taking any meds for symptoms.

## 2024-03-20 DIAGNOSIS — B3731 Acute candidiasis of vulva and vagina: Secondary | ICD-10-CM | POA: Diagnosis not present

## 2024-03-20 DIAGNOSIS — N898 Other specified noninflammatory disorders of vagina: Secondary | ICD-10-CM | POA: Diagnosis not present

## 2024-03-20 DIAGNOSIS — Z01419 Encounter for gynecological examination (general) (routine) without abnormal findings: Secondary | ICD-10-CM | POA: Diagnosis not present

## 2024-04-12 DIAGNOSIS — Z113 Encounter for screening for infections with a predominantly sexual mode of transmission: Secondary | ICD-10-CM | POA: Diagnosis not present

## 2024-04-12 DIAGNOSIS — R399 Unspecified symptoms and signs involving the genitourinary system: Secondary | ICD-10-CM | POA: Diagnosis not present
